# Patient Record
Sex: Female | Born: 1975 | Race: White | Hispanic: No | Smoking: Never smoker
Health system: Southern US, Community
[De-identification: ages and names within clinical notes are randomized; demographics above are authoritative.]

## PROBLEM LIST (undated history)

## (undated) HISTORY — PX: MOUTH SURGERY: SHX715

---

## 1999-06-23 ENCOUNTER — Encounter: Admission: RE | Admit: 1999-06-23 | Discharge: 1999-09-21 | Payer: Self-pay | Admitting: Psychology

## 2006-07-18 ENCOUNTER — Inpatient Hospital Stay (HOSPITAL_COMMUNITY): Admission: AD | Admit: 2006-07-18 | Discharge: 2006-07-18 | Payer: Self-pay | Admitting: Obstetrics and Gynecology

## 2007-02-19 ENCOUNTER — Inpatient Hospital Stay (HOSPITAL_COMMUNITY): Admission: AD | Admit: 2007-02-19 | Discharge: 2007-02-19 | Payer: Self-pay | Admitting: Obstetrics & Gynecology

## 2007-04-29 ENCOUNTER — Inpatient Hospital Stay (HOSPITAL_COMMUNITY): Admission: AD | Admit: 2007-04-29 | Discharge: 2007-05-02 | Payer: Self-pay | Admitting: Obstetrics and Gynecology

## 2007-05-04 ENCOUNTER — Inpatient Hospital Stay (HOSPITAL_COMMUNITY): Admission: AD | Admit: 2007-05-04 | Discharge: 2007-05-04 | Payer: Self-pay | Admitting: Obstetrics and Gynecology

## 2008-12-12 ENCOUNTER — Ambulatory Visit (HOSPITAL_COMMUNITY): Admission: RE | Admit: 2008-12-12 | Discharge: 2008-12-12 | Payer: Self-pay | Admitting: Gynecology

## 2009-08-14 ENCOUNTER — Ambulatory Visit (HOSPITAL_COMMUNITY): Admission: RE | Admit: 2009-08-14 | Discharge: 2009-08-14 | Payer: Self-pay | Admitting: Obstetrics and Gynecology

## 2009-09-10 DEATH — deceased

## 2010-01-02 ENCOUNTER — Inpatient Hospital Stay (HOSPITAL_COMMUNITY): Admission: AD | Admit: 2010-01-02 | Discharge: 2010-01-02 | Payer: Self-pay | Admitting: Obstetrics and Gynecology

## 2010-06-30 LAB — CBC
HCT: 38.2 % (ref 36.0–46.0)
Hemoglobin: 13.5 g/dL (ref 12.0–15.0)
MCHC: 35.3 g/dL (ref 30.0–36.0)
MCV: 93.9 fL (ref 78.0–100.0)
Platelets: 204 10*3/uL (ref 150–400)
RDW: 13.1 % (ref 11.5–15.5)

## 2010-07-23 ENCOUNTER — Inpatient Hospital Stay (HOSPITAL_COMMUNITY)
Admission: RE | Admit: 2010-07-23 | Payer: Managed Care, Other (non HMO) | Source: Ambulatory Visit | Admitting: Obstetrics and Gynecology

## 2010-07-23 ENCOUNTER — Inpatient Hospital Stay (HOSPITAL_COMMUNITY)
Admission: AD | Admit: 2010-07-23 | Discharge: 2010-07-23 | Disposition: A | Discharge status: Home / Self Care | Payer: Managed Care, Other (non HMO) | Source: Ambulatory Visit | Attending: Obstetrics and Gynecology | Admitting: Obstetrics and Gynecology

## 2010-07-23 DIAGNOSIS — O479 False labor, unspecified: Secondary | ICD-10-CM | POA: Insufficient documentation

## 2010-07-25 ENCOUNTER — Inpatient Hospital Stay (HOSPITAL_COMMUNITY)
Admission: AD | Admit: 2010-07-25 | Discharge: 2010-07-27 | DRG: 775 | Disposition: A | Discharge status: Home / Self Care | Payer: Managed Care, Other (non HMO) | Source: Ambulatory Visit | Attending: Obstetrics and Gynecology | Admitting: Obstetrics and Gynecology

## 2010-07-25 LAB — CBC
MCH: 32.3 pg (ref 26.0–34.0)
MCHC: 33.7 g/dL (ref 30.0–36.0)
Platelets: 185 10*3/uL (ref 150–400)
RDW: 13.2 % (ref 11.5–15.5)

## 2010-07-25 LAB — RPR: RPR Ser Ql: NONREACTIVE

## 2010-07-26 LAB — CBC
MCHC: 33.7 g/dL (ref 30.0–36.0)
Platelets: 164 10*3/uL (ref 150–400)
RDW: 13.3 % (ref 11.5–15.5)
WBC: 8.8 10*3/uL (ref 4.0–10.5)

## 2010-07-28 NOTE — Discharge Summary (Signed)
  [name], Kim Sparks                ACCOUNT NO.:  [id number]  MEDICAL RECORD NO.:  [id number]           PATIENT TYPE:  I  LOCATION:  9133                          FACILITY:  [facility]  PHYSICIAN:  Gerrit Friends. Aldona Bar, M.D.   DATE OF BIRTH:  [birth year]  DATE OF ADMISSION:  07/25/2010 DATE OF DISCHARGE:  07/27/2010                              DISCHARGE SUMMARY   DISCHARGE DIAGNOSES: 1. Term pregnancy delivered 8 pounds 7 ounces female infant, Apgar's 9     and 9. 2. Blood type O+.  PROCEDURE: 1. Induction of labor. 2. Normal spontaneous delivery. 3. Second-degree tear and repair.  SUMMARY:  This 35 year old gravida 3, para 1 was admitted at term for induction of labor.  Her pregnancy was benign.  She progressed and subsequently on the evening of April 14 had a normal spontaneous delivery of a viable 8 pounds 7 ounces female infant with Apgar's of 9 and 9 over a second-degree tear which was repaired without difficulty.  Her postpartum course was benign.  Her discharge hemoglobin was 11.0 with a white count of 8800 and a platelet count of 164,000.  On the morning of April 16, she was ambulating well, tolerating a regular diet well, having normal bowel and bladder function, was afebrile, was breast-feeding without difficulty, was desirous of discharge.  She was given instructions as far as the baby's circumcision is concerned and likewise given an instruction brochure for her postpartum course and understood all instructions well.  Discharge medications include vitamins 1 a day as long she is breast- feeding and she was given a prescription for Tylox 1-2 every 4-6 hours as needed for severe pain and Motrin 600 mg every 6 hours as needed for mild pain or cramping.  She will return to the office for followup in approximately 4 weeks' time or as needed.  CONDITION ON DISCHARGE:  Improved.     Gerrit Friends. Aldona Bar, M.D.     RMW/MEDQ  D:  07/27/2010  T:  07/27/2010  Job:   [id number]  Electronically Signed by Annamaria Helling M.D. on 07/28/2010 32:20:25 AM

## 2010-08-07 NOTE — H&P (Signed)
  Kim, Sparks                ACCOUNT NO.:  [id number]  MEDICAL RECORD NO.:  [id number]           PATIENT TYPE:  I  LOCATION:  9133                          FACILITY:  [facility]  PHYSICIAN:  Riely Oetken H. Tenny Craw, MD     DATE OF BIRTH:  06/14/1975  DATE OF ADMISSION:  07/25/2010 DATE OF DISCHARGE:                             HISTORY & PHYSICAL   CHIEF COMPLAINT:  Induction of labor.  HISTORY OF PRESENT ILLNESS:  Ms. Muchmore is a 35 year old, G3, P 1-0-1-1 who is admitted at 5 weeks and 3 days' gestational age for an induction of labor for term pregnancy.  Pregnancy has been uncomplicated to this point.  This pregnancy was conceived with gonadotropins and intrauterine insemination.  PAST MEDICAL HISTORY:  None.  PAST SURGICAL HISTORY:  None.  PAST OBSTETRIC HISTORY:  G3, P 1-0-1-1, in 2009 the patient had a post- term spontaneous vaginal delivery of a female infant weighing 7 pounds and 5 ounces.  In 2011, the patient had an 8-week spontaneous miscarriage.  PAST GYNECOLOGICAL HISTORY:  No abnormal Pap smears or sexually transmitted diseases.  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  MEDICATIONS:  Prenatal vitamins.  ALLERGIES:  PENICILLIN.  PRENATAL LABORATORY DATA:  O positive, antibody screen negative, RPR nonreactive, rubella titer immune, hepatitis B surface antigen negative, HIV nonreactive, and declined genetic screening.  1-hour Glucola 89. Pap smear within normal limits.  Gonorrhea and Chlamydia is negative. Ultrasound at 20 weeks showed normal fetal anatomy, female infant, posterior placenta.  An ultrasound for size less than dates performed at 33 weeks demonstrate an estimated fetal weight of 1928 grams, 45th percentile, and amniotic fluid index of 17.2.  Group B strep is negative.  ASSESSMENT AND PLAN:  This is a 35 year old, G3, P1 who presents for a term induction of labor.  The patient may have epidural on  request.  We will place misoprostol 25 mcg per vagina x1 for cervical ripening and then reassess after that.     Freddrick March. Tenny Craw, MD     KHR/MEDQ  D:  07/25/2010  T:  07/26/2010  Job:  [id number]  Electronically Signed by Waynard Reeds MD on 08/07/2010 10:47:09 AM

## 2010-12-31 LAB — COMPREHENSIVE METABOLIC PANEL
ALT: 19
AST: 21
Albumin: 3.1 — ABNORMAL LOW
Alkaline Phosphatase: 100
Calcium: 8.5
GFR calc Af Amer: >60
Potassium: 3.4 — ABNORMAL LOW
Sodium: 137
Total Protein: 6.4

## 2010-12-31 LAB — CBC
HCT: 30.9 — ABNORMAL LOW
Hemoglobin: 13.3
MCHC: 35
MCHC: 35.2
MCV: 98
Platelets: 172
RBC: 3.15 — ABNORMAL LOW
RBC: 3.83 — ABNORMAL LOW
RDW: 12.4
WBC: 8.7

## 2010-12-31 LAB — URINALYSIS, ROUTINE W REFLEX MICROSCOPIC
Bilirubin Urine: NEGATIVE
Glucose, UA: NEGATIVE
Ketones, ur: NEGATIVE
Nitrite: NEGATIVE
Specific Gravity, Urine: 1.01
pH: 5.5

## 2010-12-31 LAB — RPR: RPR Ser Ql: NONREACTIVE

## 2010-12-31 LAB — URINE MICROSCOPIC-ADD ON: RBC / HPF: NONE SEEN

## 2013-10-07 ENCOUNTER — Encounter (HOSPITAL_COMMUNITY): Payer: Self-pay | Admitting: Emergency Medicine

## 2013-10-07 ENCOUNTER — Emergency Department (HOSPITAL_COMMUNITY)
Admission: EM | Admit: 2013-10-07 | Discharge: 2013-10-07 | Disposition: A | Discharge status: Home / Self Care | Payer: BC Managed Care – PPO | Attending: Emergency Medicine | Admitting: Emergency Medicine

## 2013-10-07 DIAGNOSIS — Z792 Long term (current) use of antibiotics: Secondary | ICD-10-CM | POA: Insufficient documentation

## 2013-10-07 DIAGNOSIS — Z79899 Other long term (current) drug therapy: Secondary | ICD-10-CM | POA: Insufficient documentation

## 2013-10-07 DIAGNOSIS — Z87828 Personal history of other (healed) physical injury and trauma: Secondary | ICD-10-CM | POA: Insufficient documentation

## 2013-10-07 DIAGNOSIS — R509 Fever, unspecified: Secondary | ICD-10-CM | POA: Insufficient documentation

## 2013-10-07 DIAGNOSIS — R197 Diarrhea, unspecified: Secondary | ICD-10-CM

## 2013-10-07 DIAGNOSIS — Z880 Allergy status to penicillin: Secondary | ICD-10-CM | POA: Insufficient documentation

## 2013-10-07 LAB — CBC WITH DIFFERENTIAL/PLATELET
BASOS ABS: 0.1 10*3/uL (ref 0.0–0.1)
BASOS PCT: 1 % (ref 0–1)
EOS PCT: 7 % — AB (ref 0–5)
Eosinophils Absolute: 0.3 10*3/uL (ref 0.0–0.7)
HCT: 35.9 % — ABNORMAL LOW (ref 36.0–46.0)
Hemoglobin: 12.1 g/dL (ref 12.0–15.0)
LYMPHS PCT: 30 % (ref 12–46)
Lymphs Abs: 1.2 10*3/uL (ref 0.7–4.0)
MCH: 30.3 pg (ref 26.0–34.0)
MCHC: 33.7 g/dL (ref 30.0–36.0)
MCV: 90 fL (ref 78.0–100.0)
Monocytes Absolute: 0.8 10*3/uL (ref 0.1–1.0)
Monocytes Relative: 21 % — ABNORMAL HIGH (ref 3–12)
NEUTROS ABS: 1.6 10*3/uL — AB (ref 1.7–7.7)
Neutrophils Relative %: 41 % — ABNORMAL LOW (ref 43–77)
PLATELETS: 218 10*3/uL (ref 150–400)
RBC: 3.99 MIL/uL (ref 3.87–5.11)
RDW: 12.5 % (ref 11.5–15.5)
WBC: 3.9 10*3/uL — AB (ref 4.0–10.5)

## 2013-10-07 LAB — COMPREHENSIVE METABOLIC PANEL
ALBUMIN: 3.4 g/dL — AB (ref 3.5–5.2)
ALT: 25 U/L (ref 0–35)
AST: 30 U/L (ref 0–37)
Alkaline Phosphatase: 51 U/L (ref 39–117)
BUN: 7 mg/dL (ref 6–23)
CALCIUM: 8.9 mg/dL (ref 8.4–10.5)
CHLORIDE: 104 meq/L (ref 96–112)
CO2: 29 meq/L (ref 19–32)
Creatinine, Ser: 0.68 mg/dL (ref 0.50–1.10)
GFR calc Af Amer: >90 mL/min (ref >90)
Glucose, Bld: 70 mg/dL (ref 70–99)
Potassium: 4 mEq/L (ref 3.7–5.3)
SODIUM: 142 meq/L (ref 137–147)
Total Bilirubin: 0.5 mg/dL (ref 0.3–1.2)
Total Protein: 7.3 g/dL (ref 6.0–8.3)

## 2013-10-07 LAB — CLOSTRIDIUM DIFFICILE BY PCR: Toxigenic C. Difficile by PCR: NEGATIVE

## 2013-10-07 MED ORDER — METRONIDAZOLE 500 MG PO TABS: 500.0000 mg | ORAL_TABLET | Freq: Two times a day (BID) | ORAL | Status: DC

## 2013-10-07 MED ORDER — LACTINEX PO CHEW: 1.0000 | CHEWABLE_TABLET | Freq: Three times a day (TID) | ORAL | Status: DC

## 2013-10-07 NOTE — ED Notes (Signed)
Pt c/o fever, chills, diarrhea x 6 days, pt was seen by PCP Friday put on antbx, fever has subsided but diarrhea continues.

## 2013-10-07 NOTE — ED Provider Notes (Signed)
CSN: [id number]     Arrival date & time 10/07/13  [id number] History  First MD Initiated Contact with Patient 10/07/13 0735     Chief Complaint  Patient presents with  . Diarrhea    HPI Comments: It started earlier in the week.  Initially it was brown colored loose diarrhea.  Then she started having fever and chills.  The fevers and chills have resolved at this point but she continues to have diarrhea especially after eating.   Last night she had green beans, chicken and rice and had another episode of multiple loose stools.  Her husband is travelling this coming week and she wants to be OK to take care of her children.  Patient is a 38 y.o. female presenting with diarrhea. The history is provided by the patient.  Diarrhea Quality:  Mucous Severity:  Moderate Onset quality:  Gradual Number of episodes:  3 yesterday after a meal, 3 this am after eating Duration:  1 week Timing:  Intermittent Relieved by:  Nothing Exacerbated by: eating. Risk factors: sick contacts (son had a mild illness a week ago)   Risk factors: no suspicious food intake and no travel to endemic areas   She saw her primary doctor this week and was started on cipro.  She started that on Friday.  She had been on some antibiotics recently for gum surgery.  That was last week.    History reviewed. No pertinent past medical history. Past Surgical History  Procedure Laterality Date  . Mouth surgery     No family history on file. History  Substance Use Topics  . Smoking status: Never Smoker   . Smokeless tobacco: Not on file  . Alcohol Use: Yes     Comment: occ   OB History   Grav Para Term Preterm Abortions TAB SAB Ect Mult Living                 Review of Systems  Gastrointestinal: Positive for diarrhea.  All other systems reviewed and are negative.     Allergies  Penicillins  Home Medications   Prior to Admission medications   Medication Sig Start Date End Date Taking? Authorizing Provider   ciprofloxacin (CIPRO) 500 MG tablet Take 500 mg by mouth 2 (two) times daily. For 5 days started on 10-05-2013 10/05/13  Yes Historical Provider, MD  escitalopram (LEXAPRO) 5 MG tablet Take 5 mg by mouth at bedtime.   Yes Historical Provider, MD  lactobacillus acidophilus & bulgar (LACTINEX) chewable tablet Chew 1 tablet by mouth 3 (three) times daily with meals. 10/07/13   Linwood DibblesJon Knapp, MD  metroNIDAZOLE (FLAGYL) 500 MG tablet Take 1 tablet (500 mg total) by mouth 2 (two) times daily. 10/07/13   Linwood DibblesJon Knapp, MD   BP 105/68  Pulse 55  Temp(Src) 97.5 F (36.4 C) (Oral)  Resp 20  Ht 5\' 3"  (1.6 m)  Wt 108 lb (48.988 kg)  BMI 19.14 kg/m2  SpO2 100%  LMP 09/23/2013 Physical Exam  Nursing note and vitals reviewed. Constitutional: She appears well-developed and well-nourished. No distress.  HENT:  Head: Normocephalic and atraumatic.  Right Ear: External ear normal.  Left Ear: External ear normal.  Eyes: Conjunctivae are normal. Right eye exhibits no discharge. Left eye exhibits no discharge. No scleral icterus.  Neck: Neck supple. No tracheal deviation present.  Cardiovascular: Normal rate, regular rhythm and intact distal pulses.   Pulmonary/Chest: Effort normal and breath sounds normal. No stridor. No respiratory distress. She has no wheezes. She  has no rales.  Abdominal: Soft. Bowel sounds are normal. She exhibits no distension. There is no tenderness. There is no rebound and no guarding.  Musculoskeletal: She exhibits no edema and no tenderness.  Neurological: She is alert. She has normal strength. No cranial nerve deficit (no facial droop, extraocular movements intact, no slurred speech) or sensory deficit. She exhibits normal muscle tone. She displays no seizure activity. Coordination normal.  Skin: Skin is warm and dry. No rash noted.  Psychiatric: She has a normal mood and affect.    ED Course  Procedures (including critical care time) Labs Review Labs Reviewed  CBC WITH DIFFERENTIAL -  Abnormal; Notable for the following:    WBC 3.9 (*)    HCT 35.9 (*)    Neutrophils Relative % 41 (*)    Neutro Abs 1.6 (*)    Monocytes Relative 21 (*)    Eosinophils Relative 7 (*)    All other components within normal limits  COMPREHENSIVE METABOLIC PANEL - Abnormal; Notable for the following:    Albumin 3.4 (*)    All other components within normal limits  STOOL CULTURE  CLOSTRIDIUM DIFFICILE BY PCR      MDM   Final diagnoses:  Diarrhea   Patient's concerned about her persistent symptoms. She is no longer having fever but she continues to have diarrhea. The patient was on antibiotics recently. It is possible her symptoms could be related to C. Difficile.   RX for flagyl and probiotics.  Follow up with pcp.  At this time there does not appear to be any evidence of an acute emergency medical condition and the patient appears stable for discharge with appropriate outpatient follow up.    Linwood DibblesJon Knapp, MD 10/07/13 1004

## 2013-10-07 NOTE — Discharge Instructions (Signed)
Clostridium Difficile Toxin This is a test which may be done when a patient has diarrhea that lasts for several days, or has abdominal pain, fever, and nausea after antibiotic therapy.  This test looks for the presence of Clostridium difficile (C.diff.) toxin in a stool sample. C.diff. is a germ (bacterium) that is one of the groups of bacteria that are usually in the colon, called "normal flora." If something upsets the growth of the other normal flora, C.diff. may overgrow and disrupt the balance of bacteria in the colon. C. diff. may produce two toxins, A and B. The combination of overgrowth and toxins may cause prolonged diarrhea. The toxins may damage the lining of the colon and lead to colitis.  While some cases of C. diff. diarrhea and colitis do not require treatment, others require specific oral antibiotic therapy. Most patients improve as the normal flora re-establishes itself, but about some may have one or more relapses, with symptoms and detectible toxin levels coming back. PREPARATION FOR TEST There is no special preparation for the test. A fresh stool sample is collected in a sterile container. The sample should not be mixed with urine or water. The stool should be taken to the lab within an hour. It may be refrigerated or frozen and taken to the lab as soon as possible. The container should be labeled with your name and the date and time of the stool collection.  NORMAL FINDINGS Negative Tissue Culture (no toxin identified) Ranges for normal findings may vary among different laboratories and hospitals. You should always check with your doctor after having lab work or other tests done to discuss the meaning of your test results and whether your values are considered within normal limits. MEANING OF TEST  Your caregiver will go over the test results with you and discuss the importance and meaning of your results, as well as treatment options and the need for additional tests if  necessary. OBTAINING THE TEST RESULTS It is your responsibility to obtain your test results. Ask the lab or department performing the test when and how you will get your results. Document Released: 04/21/2004 Document Revised: 06/21/2011 Document Reviewed: 03/07/2008 [facility] CenterExitCare Patient Information 2015 [place], [place]. This information is not intended to replace advice given to you by your health care provider. Make sure you discuss any questions you have with your health care provider.  Diarrhea Diarrhea is frequent loose and watery bowel movements. It can cause you to feel weak and dehydrated. Dehydration can cause you to become tired and thirsty, have a dry mouth, and have decreased urination that often is dark yellow. Diarrhea is a sign of another problem, most often an infection that will not last long. In most cases, diarrhea typically lasts 2-3 days. However, it can last longer if it is a sign of something more serious. It is important to treat your diarrhea as directed by your caregive to lessen or prevent future episodes of diarrhea. CAUSES  Some common causes include:  Gastrointestinal infections caused by viruses, bacteria, or parasites.  Food poisoning or food allergies.  Certain medicines, such as antibiotics, chemotherapy, and laxatives.  Artificial sweeteners and fructose.  Digestive disorders. HOME CARE INSTRUCTIONS  Ensure adequate fluid intake (hydration): have 1 cup (8 oz) of fluid for each diarrhea episode. Avoid fluids that contain simple sugars or sports drinks, fruit juices, whole milk products, and sodas. Your urine should be clear or pale yellow if you are drinking enough fluids. Hydrate with an oral rehydration solution that you  can purchase at pharmacies, retail stores, and online. You can prepare an oral rehydration solution at home by mixing the following ingredients together:   - tsp table salt.   tsp baking soda.   tsp salt substitute containing potassium  chloride.  1  tablespoons sugar.  1 L (34 oz) of water.  Certain foods and beverages may increase the speed at which food moves through the gastrointestinal (GI) tract. These foods and beverages should be avoided and include:  Caffeinated and alcoholic beverages.  High-fiber foods, such as raw fruits and vegetables, nuts, seeds, and whole grain breads and cereals.  Foods and beverages sweetened with sugar alcohols, such as xylitol, sorbitol, and mannitol.  Some foods may be well tolerated and may help thicken stool including:  Starchy foods, such as rice, toast, pasta, low-sugar cereal, oatmeal, grits, baked potatoes, crackers, and bagels.  Bananas.  Applesauce.  Add probiotic-rich foods to help increase healthy bacteria in the GI tract, such as yogurt and fermented milk products.  Wash your hands well after each diarrhea episode.  Only take over-the-counter or prescription medicines as directed by your caregiver.  Take a warm bath to relieve any burning or pain from frequent diarrhea episodes. SEEK IMMEDIATE MEDICAL CARE IF:   You are unable to keep fluids down.  You have persistent vomiting.  You have blood in your stool, or your stools are black and tarry.  You do not urinate in 6-8 hours, or there is only a small amount of very dark urine.  You have abdominal pain that increases or localizes.  You have weakness, dizziness, confusion, or lightheadedness.  You have a severe headache.  Your diarrhea gets worse or does not get better.  You have a fever or persistent symptoms for more than 2-3 days.  You have a fever and your symptoms suddenly get worse. MAKE SURE YOU:   Understand these instructions.  Will watch your condition.  Will get help right away if you are not doing well or get worse. Document Released: 03/19/2002 Document Revised: 03/15/2012 Document Reviewed: 12/05/2011 [facility] CenterExitCare Patient Information 2015 [place]Care, [place]LL[place]. This information is not  intended to replace advice given to you by your health care provider. Make sure you discuss any questions you have with your health care provider.

## 2013-11-29 ENCOUNTER — Other Ambulatory Visit: Payer: Self-pay | Admitting: Obstetrics and Gynecology

## 2013-11-30 LAB — CYTOLOGY - PAP

## 2015-10-11 DIAGNOSIS — F909 Attention-deficit hyperactivity disorder, unspecified type: Secondary | ICD-10-CM | POA: Diagnosis not present

## 2015-12-27 DIAGNOSIS — J0111 Acute recurrent frontal sinusitis: Secondary | ICD-10-CM | POA: Diagnosis not present

## 2015-12-27 DIAGNOSIS — Z1383 Encounter for screening for respiratory disorder NEC: Secondary | ICD-10-CM | POA: Diagnosis not present

## 2015-12-27 DIAGNOSIS — F411 Generalized anxiety disorder: Secondary | ICD-10-CM | POA: Diagnosis not present

## 2016-01-01 DIAGNOSIS — F411 Generalized anxiety disorder: Secondary | ICD-10-CM | POA: Diagnosis not present

## 2016-01-30 DIAGNOSIS — M79642 Pain in left hand: Secondary | ICD-10-CM | POA: Diagnosis not present

## 2016-02-28 DIAGNOSIS — Z23 Encounter for immunization: Secondary | ICD-10-CM | POA: Diagnosis not present

## 2016-04-08 DIAGNOSIS — F909 Attention-deficit hyperactivity disorder, unspecified type: Secondary | ICD-10-CM | POA: Diagnosis not present

## 2016-06-03 DIAGNOSIS — R05 Cough: Secondary | ICD-10-CM | POA: Diagnosis not present

## 2016-06-03 DIAGNOSIS — J069 Acute upper respiratory infection, unspecified: Secondary | ICD-10-CM | POA: Diagnosis not present

## 2016-06-11 DIAGNOSIS — M940 Chondrocostal junction syndrome [Tietze]: Secondary | ICD-10-CM | POA: Diagnosis not present

## 2016-06-23 DIAGNOSIS — N63 Unspecified lump in unspecified breast: Secondary | ICD-10-CM | POA: Diagnosis not present

## 2016-06-23 DIAGNOSIS — R0781 Pleurodynia: Secondary | ICD-10-CM | POA: Diagnosis not present

## 2016-06-23 DIAGNOSIS — R0789 Other chest pain: Secondary | ICD-10-CM | POA: Diagnosis not present

## 2016-07-08 DIAGNOSIS — N6313 Unspecified lump in the right breast, lower outer quadrant: Secondary | ICD-10-CM | POA: Diagnosis not present

## 2016-08-24 DIAGNOSIS — F909 Attention-deficit hyperactivity disorder, unspecified type: Secondary | ICD-10-CM | POA: Diagnosis not present

## 2016-08-24 DIAGNOSIS — Z681 Body mass index (BMI) 19 or less, adult: Secondary | ICD-10-CM | POA: Diagnosis not present

## 2016-10-06 DIAGNOSIS — Z0000 Encounter for general adult medical examination without abnormal findings: Secondary | ICD-10-CM | POA: Diagnosis not present

## 2016-10-08 DIAGNOSIS — R945 Abnormal results of liver function studies: Secondary | ICD-10-CM | POA: Diagnosis not present

## 2016-10-08 DIAGNOSIS — Z681 Body mass index (BMI) 19 or less, adult: Secondary | ICD-10-CM | POA: Diagnosis not present

## 2016-10-08 DIAGNOSIS — F909 Attention-deficit hyperactivity disorder, unspecified type: Secondary | ICD-10-CM | POA: Diagnosis not present

## 2016-10-08 DIAGNOSIS — Z0000 Encounter for general adult medical examination without abnormal findings: Secondary | ICD-10-CM | POA: Diagnosis not present

## 2017-02-24 DIAGNOSIS — F909 Attention-deficit hyperactivity disorder, unspecified type: Secondary | ICD-10-CM | POA: Diagnosis not present

## 2017-06-08 DIAGNOSIS — F909 Attention-deficit hyperactivity disorder, unspecified type: Secondary | ICD-10-CM | POA: Diagnosis not present

## 2017-08-11 DIAGNOSIS — F4329 Adjustment disorder with other symptoms: Secondary | ICD-10-CM | POA: Diagnosis not present

## 2017-08-11 DIAGNOSIS — R1032 Left lower quadrant pain: Secondary | ICD-10-CM | POA: Diagnosis not present

## 2017-08-11 DIAGNOSIS — M25552 Pain in left hip: Secondary | ICD-10-CM | POA: Diagnosis not present

## 2017-11-29 DIAGNOSIS — Z6821 Body mass index (BMI) 21.0-21.9, adult: Secondary | ICD-10-CM | POA: Diagnosis not present

## 2017-11-29 DIAGNOSIS — F909 Attention-deficit hyperactivity disorder, unspecified type: Secondary | ICD-10-CM | POA: Diagnosis not present

## 2017-12-18 DIAGNOSIS — J020 Streptococcal pharyngitis: Secondary | ICD-10-CM | POA: Diagnosis not present

## 2018-01-29 DIAGNOSIS — Z23 Encounter for immunization: Secondary | ICD-10-CM | POA: Diagnosis not present

## 2018-06-27 DIAGNOSIS — F902 Attention-deficit hyperactivity disorder, combined type: Secondary | ICD-10-CM | POA: Diagnosis not present

## 2018-07-27 DIAGNOSIS — F909 Attention-deficit hyperactivity disorder, unspecified type: Secondary | ICD-10-CM | POA: Diagnosis not present

## 2018-07-27 DIAGNOSIS — F411 Generalized anxiety disorder: Secondary | ICD-10-CM | POA: Diagnosis not present

## 2018-08-23 DIAGNOSIS — F909 Attention-deficit hyperactivity disorder, unspecified type: Secondary | ICD-10-CM | POA: Diagnosis not present

## 2018-08-23 DIAGNOSIS — F411 Generalized anxiety disorder: Secondary | ICD-10-CM | POA: Diagnosis not present

## 2018-08-23 DIAGNOSIS — F5081 Binge eating disorder: Secondary | ICD-10-CM | POA: Diagnosis not present

## 2018-09-13 DIAGNOSIS — F902 Attention-deficit hyperactivity disorder, combined type: Secondary | ICD-10-CM | POA: Diagnosis not present

## 2018-11-16 DIAGNOSIS — F430 Acute stress reaction: Secondary | ICD-10-CM | POA: Diagnosis not present

## 2018-12-14 DIAGNOSIS — F902 Attention-deficit hyperactivity disorder, combined type: Secondary | ICD-10-CM | POA: Diagnosis not present

## 2018-12-14 DIAGNOSIS — F411 Generalized anxiety disorder: Secondary | ICD-10-CM | POA: Diagnosis not present

## 2018-12-14 DIAGNOSIS — F430 Acute stress reaction: Secondary | ICD-10-CM | POA: Diagnosis not present

## 2019-04-03 ENCOUNTER — Other Ambulatory Visit: Payer: Managed Care, Other (non HMO)

## 2019-05-08 ENCOUNTER — Ambulatory Visit (INDEPENDENT_AMBULATORY_CARE_PROVIDER_SITE_OTHER): Payer: BC Managed Care – PPO

## 2019-05-08 ENCOUNTER — Ambulatory Visit (INDEPENDENT_AMBULATORY_CARE_PROVIDER_SITE_OTHER): Payer: BC Managed Care – PPO | Admitting: Podiatry

## 2019-05-08 ENCOUNTER — Other Ambulatory Visit: Payer: Self-pay

## 2019-05-08 DIAGNOSIS — S92501A Displaced unspecified fracture of right lesser toe(s), initial encounter for closed fracture: Secondary | ICD-10-CM | POA: Diagnosis not present

## 2019-05-08 MED ORDER — HYDROCODONE-ACETAMINOPHEN 5-325 MG PO TABS: 1.0000 | ORAL_TABLET | Freq: Four times a day (QID) | ORAL | 0 refills | Status: DC | PRN

## 2019-05-10 NOTE — Progress Notes (Signed)
   HPI: 44 y.o. female presenting today as a new patient with a chief complaint of an injury to the right foot that occurred yesterday. She states she tripped over her dog which immediately caused throbbing pain to the right hallux. She reports associated swelling of the great toe. Applying pressure to the forefoot increases her pain. She has been walking on her heel but has not had any treatment for her symptoms. Patient is here for further evaluation and treatment.   No past medical history on file.   Physical Exam: General: The patient is alert and oriented x3 in no acute distress.  Dermatology: Skin is warm, dry and supple bilateral lower extremities. Negative for open lesions or macerations.  Vascular: Palpable pedal pulses bilaterally. No edema or erythema noted. Capillary refill within normal limits.  Neurological: Epicritic and protective threshold grossly intact bilaterally.   Musculoskeletal Exam: Pain with palpation noted to the right hallux proximally.   Radiographic Exam:  Closed, minimally displaced fracture of the proximal phalanx of the right hallux noted.     Assessment: 1. Fracture right hallux, proximal phalanx, minimally displaced   Plan of Care:  1. Patient evaluated. X-Rays reviewed.  2. CAM boot dispensed.  3. Prescription for Vicodin 5/325 mg provided to patient.  4. Return to clinic in 6 weeks for follow up X-Ray.       Felecia Shelling, DPM [facility]  Dr. Felecia Shelling, DPM    [address] [place], [place] [address]                Office [phone]  Fax [phone]

## 2019-06-13 ENCOUNTER — Ambulatory Visit: Payer: BC Managed Care – PPO

## 2019-06-13 ENCOUNTER — Encounter: Payer: BC Managed Care – PPO | Admitting: Podiatry

## 2019-06-27 NOTE — Progress Notes (Signed)
This encounter was created in error - please disregard.

## 2020-04-18 DIAGNOSIS — F988 Other specified behavioral and emotional disorders with onset usually occurring in childhood and adolescence: Secondary | ICD-10-CM | POA: Diagnosis not present

## 2020-04-28 ENCOUNTER — Other Ambulatory Visit: Payer: Self-pay | Admitting: Podiatry

## 2020-04-28 MED ORDER — GENTAMICIN SULFATE 0.1 % EX CREA: 1.0000 "application " | TOPICAL_CREAM | Freq: Two times a day (BID) | CUTANEOUS | 1 refills | Status: DC

## 2020-04-28 MED ORDER — DOXYCYCLINE HYCLATE 100 MG PO TABS: 100.0000 mg | ORAL_TABLET | Freq: Two times a day (BID) | ORAL | 0 refills | Status: DC

## 2020-06-19 DIAGNOSIS — Z3202 Encounter for pregnancy test, result negative: Secondary | ICD-10-CM | POA: Diagnosis not present

## 2020-06-19 DIAGNOSIS — Z30011 Encounter for initial prescription of contraceptive pills: Secondary | ICD-10-CM | POA: Diagnosis not present

## 2020-08-26 DIAGNOSIS — F988 Other specified behavioral and emotional disorders with onset usually occurring in childhood and adolescence: Secondary | ICD-10-CM | POA: Diagnosis not present

## 2020-10-29 DIAGNOSIS — F909 Attention-deficit hyperactivity disorder, unspecified type: Secondary | ICD-10-CM | POA: Diagnosis not present

## 2020-10-29 DIAGNOSIS — F329 Major depressive disorder, single episode, unspecified: Secondary | ICD-10-CM | POA: Diagnosis not present

## 2020-10-29 DIAGNOSIS — F1020 Alcohol dependence, uncomplicated: Secondary | ICD-10-CM | POA: Diagnosis not present

## 2020-11-02 DIAGNOSIS — F329 Major depressive disorder, single episode, unspecified: Secondary | ICD-10-CM | POA: Diagnosis not present

## 2020-11-02 DIAGNOSIS — F1020 Alcohol dependence, uncomplicated: Secondary | ICD-10-CM | POA: Diagnosis not present

## 2020-11-02 DIAGNOSIS — F909 Attention-deficit hyperactivity disorder, unspecified type: Secondary | ICD-10-CM | POA: Diagnosis not present

## 2020-11-03 DIAGNOSIS — F329 Major depressive disorder, single episode, unspecified: Secondary | ICD-10-CM | POA: Diagnosis not present

## 2020-11-03 DIAGNOSIS — F1020 Alcohol dependence, uncomplicated: Secondary | ICD-10-CM | POA: Diagnosis not present

## 2020-11-03 DIAGNOSIS — F909 Attention-deficit hyperactivity disorder, unspecified type: Secondary | ICD-10-CM | POA: Diagnosis not present

## 2020-11-04 DIAGNOSIS — F329 Major depressive disorder, single episode, unspecified: Secondary | ICD-10-CM | POA: Diagnosis not present

## 2020-11-04 DIAGNOSIS — F1020 Alcohol dependence, uncomplicated: Secondary | ICD-10-CM | POA: Diagnosis not present

## 2020-11-04 DIAGNOSIS — F909 Attention-deficit hyperactivity disorder, unspecified type: Secondary | ICD-10-CM | POA: Diagnosis not present

## 2020-11-05 DIAGNOSIS — F329 Major depressive disorder, single episode, unspecified: Secondary | ICD-10-CM | POA: Diagnosis not present

## 2020-11-05 DIAGNOSIS — F909 Attention-deficit hyperactivity disorder, unspecified type: Secondary | ICD-10-CM | POA: Diagnosis not present

## 2020-11-05 DIAGNOSIS — F1020 Alcohol dependence, uncomplicated: Secondary | ICD-10-CM | POA: Diagnosis not present

## 2020-11-06 DIAGNOSIS — F909 Attention-deficit hyperactivity disorder, unspecified type: Secondary | ICD-10-CM | POA: Diagnosis not present

## 2020-11-06 DIAGNOSIS — F329 Major depressive disorder, single episode, unspecified: Secondary | ICD-10-CM | POA: Diagnosis not present

## 2020-11-06 DIAGNOSIS — F1020 Alcohol dependence, uncomplicated: Secondary | ICD-10-CM | POA: Diagnosis not present

## 2020-11-07 DIAGNOSIS — F329 Major depressive disorder, single episode, unspecified: Secondary | ICD-10-CM | POA: Diagnosis not present

## 2020-11-07 DIAGNOSIS — F909 Attention-deficit hyperactivity disorder, unspecified type: Secondary | ICD-10-CM | POA: Diagnosis not present

## 2020-11-07 DIAGNOSIS — F1020 Alcohol dependence, uncomplicated: Secondary | ICD-10-CM | POA: Diagnosis not present

## 2020-11-08 DIAGNOSIS — F909 Attention-deficit hyperactivity disorder, unspecified type: Secondary | ICD-10-CM | POA: Diagnosis not present

## 2020-11-08 DIAGNOSIS — F1020 Alcohol dependence, uncomplicated: Secondary | ICD-10-CM | POA: Diagnosis not present

## 2020-11-08 DIAGNOSIS — F329 Major depressive disorder, single episode, unspecified: Secondary | ICD-10-CM | POA: Diagnosis not present

## 2020-11-09 DIAGNOSIS — F329 Major depressive disorder, single episode, unspecified: Secondary | ICD-10-CM | POA: Diagnosis not present

## 2020-11-09 DIAGNOSIS — F909 Attention-deficit hyperactivity disorder, unspecified type: Secondary | ICD-10-CM | POA: Diagnosis not present

## 2020-11-09 DIAGNOSIS — F1020 Alcohol dependence, uncomplicated: Secondary | ICD-10-CM | POA: Diagnosis not present

## 2020-11-10 DIAGNOSIS — F329 Major depressive disorder, single episode, unspecified: Secondary | ICD-10-CM | POA: Diagnosis not present

## 2020-11-10 DIAGNOSIS — F909 Attention-deficit hyperactivity disorder, unspecified type: Secondary | ICD-10-CM | POA: Diagnosis not present

## 2020-11-10 DIAGNOSIS — F1020 Alcohol dependence, uncomplicated: Secondary | ICD-10-CM | POA: Diagnosis not present

## 2020-11-11 DIAGNOSIS — F1020 Alcohol dependence, uncomplicated: Secondary | ICD-10-CM | POA: Diagnosis not present

## 2020-11-11 DIAGNOSIS — F909 Attention-deficit hyperactivity disorder, unspecified type: Secondary | ICD-10-CM | POA: Diagnosis not present

## 2020-11-11 DIAGNOSIS — F329 Major depressive disorder, single episode, unspecified: Secondary | ICD-10-CM | POA: Diagnosis not present

## 2020-11-12 DIAGNOSIS — F1020 Alcohol dependence, uncomplicated: Secondary | ICD-10-CM | POA: Diagnosis not present

## 2020-11-12 DIAGNOSIS — F329 Major depressive disorder, single episode, unspecified: Secondary | ICD-10-CM | POA: Diagnosis not present

## 2020-11-12 DIAGNOSIS — F909 Attention-deficit hyperactivity disorder, unspecified type: Secondary | ICD-10-CM | POA: Diagnosis not present

## 2020-11-13 DIAGNOSIS — F1020 Alcohol dependence, uncomplicated: Secondary | ICD-10-CM | POA: Diagnosis not present

## 2020-11-13 DIAGNOSIS — F329 Major depressive disorder, single episode, unspecified: Secondary | ICD-10-CM | POA: Diagnosis not present

## 2020-11-13 DIAGNOSIS — F909 Attention-deficit hyperactivity disorder, unspecified type: Secondary | ICD-10-CM | POA: Diagnosis not present

## 2020-11-14 DIAGNOSIS — F1020 Alcohol dependence, uncomplicated: Secondary | ICD-10-CM | POA: Diagnosis not present

## 2020-11-14 DIAGNOSIS — F329 Major depressive disorder, single episode, unspecified: Secondary | ICD-10-CM | POA: Diagnosis not present

## 2020-11-14 DIAGNOSIS — F909 Attention-deficit hyperactivity disorder, unspecified type: Secondary | ICD-10-CM | POA: Diagnosis not present

## 2020-11-15 DIAGNOSIS — F909 Attention-deficit hyperactivity disorder, unspecified type: Secondary | ICD-10-CM | POA: Diagnosis not present

## 2020-11-15 DIAGNOSIS — F1020 Alcohol dependence, uncomplicated: Secondary | ICD-10-CM | POA: Diagnosis not present

## 2020-11-15 DIAGNOSIS — F329 Major depressive disorder, single episode, unspecified: Secondary | ICD-10-CM | POA: Diagnosis not present

## 2020-11-16 DIAGNOSIS — F1020 Alcohol dependence, uncomplicated: Secondary | ICD-10-CM | POA: Diagnosis not present

## 2020-11-16 DIAGNOSIS — F329 Major depressive disorder, single episode, unspecified: Secondary | ICD-10-CM | POA: Diagnosis not present

## 2020-11-16 DIAGNOSIS — F909 Attention-deficit hyperactivity disorder, unspecified type: Secondary | ICD-10-CM | POA: Diagnosis not present

## 2020-11-17 DIAGNOSIS — F329 Major depressive disorder, single episode, unspecified: Secondary | ICD-10-CM | POA: Diagnosis not present

## 2020-11-17 DIAGNOSIS — F1020 Alcohol dependence, uncomplicated: Secondary | ICD-10-CM | POA: Diagnosis not present

## 2020-11-17 DIAGNOSIS — F909 Attention-deficit hyperactivity disorder, unspecified type: Secondary | ICD-10-CM | POA: Diagnosis not present

## 2020-11-18 DIAGNOSIS — F1020 Alcohol dependence, uncomplicated: Secondary | ICD-10-CM | POA: Diagnosis not present

## 2020-11-18 DIAGNOSIS — F329 Major depressive disorder, single episode, unspecified: Secondary | ICD-10-CM | POA: Diagnosis not present

## 2020-11-18 DIAGNOSIS — F909 Attention-deficit hyperactivity disorder, unspecified type: Secondary | ICD-10-CM | POA: Diagnosis not present

## 2020-12-14 ENCOUNTER — Emergency Department (HOSPITAL_BASED_OUTPATIENT_CLINIC_OR_DEPARTMENT_OTHER): Payer: BC Managed Care – PPO | Admitting: Radiology

## 2020-12-14 ENCOUNTER — Other Ambulatory Visit: Payer: Self-pay

## 2020-12-14 ENCOUNTER — Emergency Department (HOSPITAL_BASED_OUTPATIENT_CLINIC_OR_DEPARTMENT_OTHER): Payer: BC Managed Care – PPO

## 2020-12-14 ENCOUNTER — Encounter (HOSPITAL_BASED_OUTPATIENT_CLINIC_OR_DEPARTMENT_OTHER): Payer: Self-pay | Admitting: Obstetrics and Gynecology

## 2020-12-14 ENCOUNTER — Emergency Department (HOSPITAL_BASED_OUTPATIENT_CLINIC_OR_DEPARTMENT_OTHER)
Admission: EM | Admit: 2020-12-14 | Discharge: 2020-12-14 | Disposition: A | Discharge status: Home / Self Care | Payer: BC Managed Care – PPO | Attending: Emergency Medicine | Admitting: Emergency Medicine

## 2020-12-14 DIAGNOSIS — R0789 Other chest pain: Secondary | ICD-10-CM | POA: Diagnosis not present

## 2020-12-14 DIAGNOSIS — S2221XA Fracture of manubrium, initial encounter for closed fracture: Secondary | ICD-10-CM | POA: Diagnosis not present

## 2020-12-14 DIAGNOSIS — R0602 Shortness of breath: Secondary | ICD-10-CM | POA: Diagnosis not present

## 2020-12-14 DIAGNOSIS — W108XXA Fall (on) (from) other stairs and steps, initial encounter: Secondary | ICD-10-CM | POA: Diagnosis not present

## 2020-12-14 DIAGNOSIS — S299XXA Unspecified injury of thorax, initial encounter: Secondary | ICD-10-CM | POA: Diagnosis not present

## 2020-12-14 LAB — BASIC METABOLIC PANEL
Anion gap: 8 (ref 5–15)
BUN: 19 mg/dL (ref 6–20)
CO2: 24 mmol/L (ref 22–32)
Calcium: 8.5 mg/dL — ABNORMAL LOW (ref 8.9–10.3)
Chloride: 107 mmol/L (ref 98–111)
Creatinine, Ser: 0.64 mg/dL (ref 0.44–1.00)
GFR, Estimated: >60 mL/min (ref >60)
Glucose, Bld: 79 mg/dL (ref 70–99)
Potassium: 3.8 mmol/L (ref 3.5–5.1)
Sodium: 139 mmol/L (ref 135–145)

## 2020-12-14 LAB — CBC
HCT: 39.5 % (ref 36.0–46.0)
Hemoglobin: 13.1 g/dL (ref 12.0–15.0)
MCH: 32.8 pg (ref 26.0–34.0)
MCHC: 33.2 g/dL (ref 30.0–36.0)
MCV: 98.8 fL (ref 80.0–100.0)
Platelets: 306 10*3/uL (ref 150–400)
RBC: 4 MIL/uL (ref 3.87–5.11)
RDW: 12.3 % (ref 11.5–15.5)
WBC: 5.8 10*3/uL (ref 4.0–10.5)
nRBC: 0 % (ref 0.0–0.2)

## 2020-12-14 LAB — TROPONIN I (HIGH SENSITIVITY)
Troponin I (High Sensitivity): <2 ng/L (ref <18)
Troponin I (High Sensitivity): <2 ng/L (ref <18)

## 2020-12-14 LAB — PREGNANCY, URINE: Preg Test, Ur: NEGATIVE

## 2020-12-14 MED ORDER — HYDROCODONE-ACETAMINOPHEN 5-325 MG PO TABS: 1.0000 | ORAL_TABLET | Freq: Four times a day (QID) | ORAL | 0 refills | Status: AC | PRN

## 2020-12-14 MED ORDER — HYDROCODONE-ACETAMINOPHEN 5-325 MG PO TABS: 1.0000 | ORAL_TABLET | Freq: Four times a day (QID) | ORAL | 0 refills | Status: DC | PRN

## 2020-12-14 MED ORDER — IOHEXOL 350 MG/ML SOLN
60.0000 mL | Freq: Once | INTRAVENOUS | Status: AC | PRN
  Administered 2020-12-14: 60 mL via INTRAVENOUS

## 2020-12-14 NOTE — ED Notes (Signed)
D/c instructions given at length voiced understanding. Ice pack provided. Pt has her Incentive spirometry and voices understanding.

## 2020-12-14 NOTE — ED Provider Notes (Signed)
[facility] EMERGENCY DEPT Provider Note   CSN: [id number] Arrival date & time: 12/14/20  2021     History Chief Complaint  Patient presents with   Chest Pain    Kim Sparks is a 45 y.o. female.   Chest Pain Associated symptoms: no abdominal pain, no back pain, no cough, no fever, no palpitations, no shortness of breath and no vomiting    Patient is a 45 year old female presenting to the emergency department after a fall down 4 stairs on Tuesday.  She states that she initially felt no pain or injury after the fall.  She states that she grabbed the railing of the stair and may have struck her chest on the railing on the way down.  Over the past few days she has developed worsening shortness of breath and chest discomfort.  She states that it hurts to take a deep breath.  She denies any other injuries or complaints.  She arrived GCS 15, ABC intact.  History reviewed. No pertinent past medical history.  There are no problems to display for this patient.   Past Surgical History:  Procedure Laterality Date   MOUTH SURGERY       OB History     Gravida      Para      Term      Preterm      AB      Living  2      SAB      IAB      Ectopic      Multiple      Live Births              No family history on file.  Social History   Tobacco Use   Smoking status: Never    Passive exposure: Never   Smokeless tobacco: Never  Vaping Use   Vaping Use: Never used  Substance Use Topics   Alcohol use: Yes    Comment: occ   Drug use: No    Home Medications Prior to Admission medications   Medication Sig Start Date End Date Taking? Authorizing Provider  ciprofloxacin (CIPRO) 500 MG tablet Take 500 mg by mouth 2 (two) times daily. For 5 days started on 10-05-2013 10/05/13   [provider]  doxycycline (VIBRA-TABS) 100 MG tablet Take 1 tablet (100 mg total) by mouth 2 (two) times daily. 04/28/20   Felecia Shelling, DPM  escitalopram  (LEXAPRO) 5 MG tablet Take 5 mg by mouth at bedtime.    [provider]  gentamicin cream (GARAMYCIN) 0.1 % Apply 1 application topically 2 (two) times daily. 04/28/20   Felecia Shelling, DPM  HYDROcodone-acetaminophen (NORCO/VICODIN) 5-325 MG tablet Take 1-2 tablets by mouth every 6 (six) hours as needed for up to 3 days for severe pain. 12/14/20 12/17/20  Ernie Avena, MD  lactobacillus acidophilus & bulgar (LACTINEX) chewable tablet Chew 1 tablet by mouth 3 (three) times daily with meals. 10/07/13   Linwood Dibbles, MD  metroNIDAZOLE (FLAGYL) 500 MG tablet Take 1 tablet (500 mg total) by mouth 2 (two) times daily. 10/07/13   Linwood Dibbles, MD    Allergies    Penicillins  Review of Systems   Review of Systems  Constitutional:  Negative for chills and fever.  HENT:  Negative for ear pain and sore throat.   Eyes:  Negative for pain and visual disturbance.  Respiratory:  Negative for cough and shortness of breath.   Cardiovascular:  Positive for chest  pain. Negative for palpitations.  Gastrointestinal:  Negative for abdominal pain and vomiting.  Genitourinary:  Negative for dysuria and hematuria.  Musculoskeletal:  Negative for arthralgias and back pain.  Skin:  Negative for color change and rash.  Neurological:  Negative for seizures and syncope.  All other systems reviewed and are negative.  Physical Exam Updated Vital Signs BP 121/83   Pulse 61   Temp 98.3 F (36.8 C)   Resp 18   Ht 5\' 3"  (1.6 m)   Wt 54.4 kg   LMP 12/08/2020 (Approximate)   SpO2 99%   BMI 21.26 kg/m   Physical Exam Vitals and nursing note reviewed.  Constitutional:      General: She is not in acute distress.    Appearance: She is well-developed.     Comments: GCS 15, ABC intact  HENT:     Head: Normocephalic and atraumatic.  Eyes:     Extraocular Movements: Extraocular movements intact.     Conjunctiva/sclera: Conjunctivae normal.     Pupils: Pupils are equal, round, and reactive to light.  Neck:      Comments: No midline tenderness to palpation of the cervical spine.  Range of motion intact Cardiovascular:     Rate and Rhythm: Normal rate and regular rhythm.     Heart sounds: No murmur heard. Pulmonary:     Effort: Pulmonary effort is normal. No respiratory distress.     Breath sounds: Normal breath sounds.  Chest:     Comments: Clavicles stable nontender to AP compression.  Chest wall tenderness palpation, specifically pain on anterior palpation of the sternum Abdominal:     Palpations: Abdomen is soft.     Tenderness: There is no abdominal tenderness.  Musculoskeletal:     Cervical back: Neck supple.     Comments: No midline tenderness to palpation of the thoracic or lumbar spine.  Extremities atraumatic with intact range of motion  Skin:    General: Skin is warm and dry.  Neurological:     Mental Status: She is alert.     Comments: Cranial nerves II through XII grossly intact.  Moving all 4 extremities spontaneously.  Sensation grossly intact all 4 extremities    ED Results / Procedures / Treatments   Labs (all labs ordered are listed, but only abnormal results are displayed) Labs Reviewed  BASIC METABOLIC PANEL - Abnormal; Notable for the following components:      Result Value   Calcium 8.5 (*)    All other components within normal limits  CBC  PREGNANCY, URINE  TROPONIN I (HIGH SENSITIVITY)  TROPONIN I (HIGH SENSITIVITY)    EKG EKG Interpretation  Date/Time:  Sunday December 14 2020 20:28:00 EDT Ventricular Rate:  65 PR Interval:  148 QRS Duration: 84 QT Interval:  400 QTC Calculation: 416 R Axis:   83 Text Interpretation: Normal sinus rhythm Biatrial enlargement Abnormal ECG Confirmed by [id number] ([id number]) on 12/14/2020 8:40:11 PM  Radiology CT CHEST W CONTRAST  Result Date: 12/14/2020 CLINICAL DATA:  Rib fracture suspected, traumatic Chest trauma, mod-severe Poss sternal fx. Fall downstairs. EXAM: CT CHEST WITH CONTRAST TECHNIQUE: Multidetector CT  imaging of the chest was performed during intravenous contrast administration. CONTRAST:  62mL OMNIPAQUE IOHEXOL 350 MG/ML SOLN COMPARISON:  None. FINDINGS: Cardiovascular: Heart is normal size. Aorta is normal caliber. Mediastinum/Nodes: No mediastinal, hilar, or axillary adenopathy. Trachea and esophagus are unremarkable. Thyroid unremarkable. Lungs/Pleura: Lungs are clear. No focal airspace opacities or suspicious nodules. No effusions. Upper Abdomen: Imaging into  the upper abdomen demonstrates no acute findings. Musculoskeletal: Chest wall soft tissues are unremarkable. There is buckling of the anterior cortex in the manubrium compatible with fracture. No thoracic or rib fracture noted. IMPRESSION: Nondisplaced manubrial fracture. No visible rib fracture or pneumothorax. Electronically Signed   By: Charlett Nose M.D.   On: 12/14/2020 23:05    Procedures Procedures   Medications Ordered in ED Medications  iohexol (OMNIPAQUE) 350 MG/ML injection 60 mL (60 mLs Intravenous Contrast Given 12/14/20 2249)    ED Course  I have reviewed the triage vital signs and the nursing notes.  Pertinent labs & imaging results that were available during my care of the patient were reviewed by me and considered in my medical decision making (see chart for details).    MDM Rules/Calculators/A&P                           45 year old female presenting to the emergency department with chest pain after a fall downstairs several days ago.  No head trauma or loss of consciousness.  She did not seek medical care after the incident.  She arrived GCS 15, ABC intact.  She endorses worsening chest pain and shortness of breath.  She has tenderness of the sternum on physical exam with no other evidence of injury on primary secondary survey.  EKG revealed no ischemic changes.  Troponins x2 were negative.  CBC was without a leukocytosis or anemia.  CT of the chest did reveal a nondisplaced fracture of the manubrium.  No evidence of  focal consolidation or other acute cardiac or pulmonary abnormality.  The patient was provided with an incentive spirometer and prescribed a course of Norco for pain control.  She was advised to follow-up with her PCP.  Final Clinical Impression(s) / ED Diagnoses Final diagnoses:  Closed fracture of manubrium, initial encounter    Rx / DC Orders ED Discharge Orders          Ordered    HYDROcodone-acetaminophen (NORCO/VICODIN) 5-325 MG tablet  Every 6 hours PRN,   Status:  Discontinued        12/14/20 2310    HYDROcodone-acetaminophen (NORCO/VICODIN) 5-325 MG tablet  Every 6 hours PRN,   Status:  Discontinued        12/14/20 2319    HYDROcodone-acetaminophen (NORCO/VICODIN) 5-325 MG tablet  Every 6 hours PRN        12/14/20 2330             Ernie Avena, MD 12/14/20 2333

## 2020-12-14 NOTE — Discharge Instructions (Addendum)
Your CT imaging was concerning for a nondisplaced fracture of your manubrium.  I prescribed pain medication to your pharmacy on file at [facility] in [place].  No other fractures of your ribs were identified.  You will likely have pain on inspiration and are at risk for developing pneumonia.  Will be important to do your best to take deep breaths over the next few days to continue to inflate your lungs return for repeat evaluation in the event of fever, chills, worsening shortness of breath and chest pain.

## 2020-12-14 NOTE — ED Triage Notes (Signed)
Patient reports to the ER after falling down the stairs on Tuesday. Patient states her chest has began to hurt worse since the incident and today it feels as if it is radiating up her neck and she feels ShOB.

## 2020-12-26 DIAGNOSIS — Z30011 Encounter for initial prescription of contraceptive pills: Secondary | ICD-10-CM | POA: Diagnosis not present

## 2020-12-26 DIAGNOSIS — F329 Major depressive disorder, single episode, unspecified: Secondary | ICD-10-CM | POA: Diagnosis not present

## 2021-01-14 DIAGNOSIS — F5081 Binge eating disorder: Secondary | ICD-10-CM | POA: Diagnosis not present

## 2021-01-29 ENCOUNTER — Other Ambulatory Visit (HOSPITAL_COMMUNITY): Payer: Self-pay | Admitting: Family Medicine

## 2021-01-29 DIAGNOSIS — M25552 Pain in left hip: Secondary | ICD-10-CM

## 2021-02-03 ENCOUNTER — Other Ambulatory Visit (HOSPITAL_COMMUNITY): Payer: Self-pay | Admitting: Family Medicine

## 2021-02-03 DIAGNOSIS — M25552 Pain in left hip: Secondary | ICD-10-CM

## 2021-02-04 DIAGNOSIS — M25552 Pain in left hip: Secondary | ICD-10-CM | POA: Diagnosis not present

## 2021-02-05 DIAGNOSIS — M25552 Pain in left hip: Secondary | ICD-10-CM | POA: Diagnosis not present

## 2021-02-11 ENCOUNTER — Ambulatory Visit (HOSPITAL_COMMUNITY): Payer: BC Managed Care – PPO | Attending: Family Medicine

## 2021-02-11 ENCOUNTER — Encounter (HOSPITAL_COMMUNITY): Payer: Self-pay

## 2021-02-16 DIAGNOSIS — F5081 Binge eating disorder: Secondary | ICD-10-CM | POA: Diagnosis not present

## 2021-02-16 DIAGNOSIS — F419 Anxiety disorder, unspecified: Secondary | ICD-10-CM | POA: Diagnosis not present

## 2021-02-18 DIAGNOSIS — M25552 Pain in left hip: Secondary | ICD-10-CM | POA: Diagnosis not present

## 2021-02-25 DIAGNOSIS — F411 Generalized anxiety disorder: Secondary | ICD-10-CM | POA: Diagnosis not present

## 2021-02-25 DIAGNOSIS — F1010 Alcohol abuse, uncomplicated: Secondary | ICD-10-CM | POA: Diagnosis not present

## 2021-05-15 ENCOUNTER — Other Ambulatory Visit: Payer: Self-pay | Admitting: Family Medicine

## 2021-05-15 ENCOUNTER — Ambulatory Visit
Admission: RE | Admit: 2021-05-15 | Discharge: 2021-05-15 | Disposition: A | Discharge status: Home / Self Care | Payer: BC Managed Care – PPO | Source: Ambulatory Visit | Attending: Family Medicine | Admitting: Family Medicine

## 2021-05-15 DIAGNOSIS — M25552 Pain in left hip: Secondary | ICD-10-CM

## 2021-05-21 ENCOUNTER — Other Ambulatory Visit: Payer: Self-pay | Admitting: Orthopedic Surgery

## 2021-05-21 NOTE — Progress Notes (Signed)
Sent message, via epic in basket, requesting orders in epic from surgeon.  

## 2021-05-22 NOTE — Progress Notes (Signed)
Your procedure is scheduled on:       05/26/21   Report to [facility]  Entrance   Report to admitting at   1115AM     Call this number if you have problems the morning of surgery [phone]    REMEMBER: NO  SOLID FOOD CANDY OR GUM AFTER MIDNIGHT. CLEAR LIQUIDS UNTIL     1030am      . NOTHING BY MOUTH EXCEPT CLEAR LIQUIDS UNTIL 1030am    . PLEASE FINISH ENSURE DRINK PER SURGEON ORDER  WHICH NEEDS TO BE COMPLETED AT  1030am     .      CLEAR LIQUID DIET   Foods Allowed                                                                    Coffee and tea, regular and decaf                            Fruit ices (not with fruit pulp)                                      Iced Popsicles                                    Carbonated beverages, regular and diet                                    Cranberry, grape and apple juices Sports drinks like Gatorade Lightly seasoned clear broth or consume(fat free) Sugar, honey syrup ___________________________________________________________________      BRUSH YOUR TEETH MORNING OF SURGERY AND RINSE YOUR MOUTH OUT, NO CHEWING GUM CANDY OR MINTS.     Take these medicines the morning of surgery with A SIP OF WATER:  none   DO NOT TAKE ANY DIABETIC MEDICATIONS DAY OF YOUR SURGERY                               You may not have any metal on your body including hair pins and              piercings  Do not wear jewelry, make-up, lotions, powders or perfumes, deodorant             Do not wear nail polish on your fingernails.  Do not shave  48 hours prior to surgery.              Men may shave face and neck.   Do not bring valuables to the hospital. [facility] IS NOT             RESPONSIBLE   FOR VALUABLES.  Contacts, dentures or bridgework may not be worn into surgery.  Leave suitcase in the car. After surgery it may be brought to your room.     Patients discharged the day of surgery will not  be allowed to drive  home. IF YOU ARE HAVING SURGERY AND GOING HOME THE SAME DAY, YOU MUST HAVE AN ADULT TO DRIVE YOU HOME AND BE WITH YOU FOR 24 HOURS. YOU MAY GO HOME BY TAXI OR UBER OR ORTHERWISE, BUT AN ADULT MUST ACCOMPANY YOU HOME AND STAY WITH YOU FOR 24 HOURS.  Name and phone number of your driver:  Special Instructions: N/A              Please read over the following fact sheets you were given: _____________________________________________________________________  [facility] - Preparing for Surgery Before surgery, you can play an important role.  Because skin is not sterile, your skin needs to be as free of germs as possible.  You can reduce the number of germs on your skin by washing with CHG (chlorahexidine gluconate) soap before surgery.  CHG is an antiseptic cleaner which kills germs and bonds with the skin to continue killing germs even after washing. Please DO NOT use if you have an allergy to CHG or antibacterial soaps.  If your skin becomes reddened/irritated stop using the CHG and inform your nurse when you arrive at Short Stay. Do not shave (including legs and underarms) for at least 48 hours prior to the first CHG shower.  You may shave your face/neck. Please follow these instructions carefully:  1.  Shower with CHG Soap the night before surgery and the  morning of Surgery.  2.  If you choose to wash your hair, wash your hair first as usual with your  normal  shampoo.  3.  After you shampoo, rinse your hair and body thoroughly to remove the  shampoo.                           4.  Use CHG as you would any other liquid soap.  You can apply chg directly  to the skin and wash                       Gently with a scrungie or clean washcloth.  5.  Apply the CHG Soap to your body ONLY FROM THE NECK DOWN.   Do not use on face/ open                           Wound or open sores. Avoid contact with eyes, ears mouth and genitals (private parts).                       Wash face,  Genitals (private parts) with  your normal soap.             6.  Wash thoroughly, paying special attention to the area where your surgery  will be performed.  7.  Thoroughly rinse your body with warm water from the neck down.  8.  DO NOT shower/wash with your normal soap after using and rinsing off  the CHG Soap.                9.  Pat yourself dry with a clean towel.            10.  Wear clean pajamas.            11.  Place clean sheets on your bed the night of your first shower and do not  sleep with pets. Day of Surgery : Do not apply  any lotions/deodorants the morning of surgery.  Please wear clean clothes to the hospital/surgery center.  FAILURE TO FOLLOW THESE INSTRUCTIONS MAY RESULT IN THE CANCELLATION OF YOUR SURGERY PATIENT SIGNATURE_________________________________  NURSE SIGNATURE__________________________________  ________________________________________________________________________

## 2021-05-22 NOTE — Progress Notes (Addendum)
Anesthesia Review:  PCP:  [facility]  Cardiologist : none  Chest x-ray : Ct chest-12/14/20  EKG :12/17/20  Echo : Stress test: Cardiac Cath :  Activity level: can do a flight of stairs without difficulty  Sleep Study/ CPAP : none  Fasting Blood Sugar :      / Checks Blood Sugar -- times a day:   Blood Thinner/ Instructions /Last Dose: ASA / Instructions/ Last Dose :   No covid test - ambulatory surgery  PT was 15 minutes late for preop appt.

## 2021-05-25 ENCOUNTER — Other Ambulatory Visit: Payer: Self-pay

## 2021-05-25 ENCOUNTER — Encounter (HOSPITAL_COMMUNITY): Payer: Self-pay

## 2021-05-25 ENCOUNTER — Encounter (HOSPITAL_COMMUNITY)
Admission: RE | Admit: 2021-05-25 | Discharge: 2021-05-25 | Disposition: A | Discharge status: Home / Self Care | Payer: BC Managed Care – PPO | Source: Ambulatory Visit | Attending: Orthopedic Surgery | Admitting: Orthopedic Surgery

## 2021-05-25 VITALS — BP 122/88 | HR 68 | Temp 98.6°F | Resp 16 | Ht 63.0 in | Wt 123.0 lb

## 2021-05-25 DIAGNOSIS — M84359A Stress fracture, hip, unspecified, initial encounter for fracture: Secondary | ICD-10-CM | POA: Diagnosis not present

## 2021-05-25 DIAGNOSIS — Z01812 Encounter for preprocedural laboratory examination: Secondary | ICD-10-CM | POA: Insufficient documentation

## 2021-05-25 DIAGNOSIS — Z01818 Encounter for other preprocedural examination: Secondary | ICD-10-CM

## 2021-05-25 DIAGNOSIS — X58XXXA Exposure to other specified factors, initial encounter: Secondary | ICD-10-CM | POA: Diagnosis not present

## 2021-05-25 LAB — CBC
HCT: 38.2 % (ref 36.0–46.0)
Hemoglobin: 12.7 g/dL (ref 12.0–15.0)
MCH: 32 pg (ref 26.0–34.0)
MCHC: 33.2 g/dL (ref 30.0–36.0)
MCV: 96.2 fL (ref 80.0–100.0)
Platelets: 316 10*3/uL (ref 150–400)
RBC: 3.97 MIL/uL (ref 3.87–5.11)
RDW: 12.6 % (ref 11.5–15.5)
WBC: 5.6 10*3/uL (ref 4.0–10.5)
nRBC: 0 % (ref 0.0–0.2)

## 2021-05-25 NOTE — Progress Notes (Signed)
Pt aware of surgical time change for scheduled surgery on Tuesday 05/26/2021. Pt to arrive at [facility] admitting at 10:45 am. No food after midnight; clear liquids from midnight till 0930 then nothing by mouth. Please consume entire pre surgery drink by 0930 then nothing by mouth.

## 2021-05-26 ENCOUNTER — Ambulatory Visit (HOSPITAL_COMMUNITY): Payer: BC Managed Care – PPO | Admitting: Certified Registered Nurse Anesthetist

## 2021-05-26 ENCOUNTER — Ambulatory Visit (HOSPITAL_COMMUNITY): Payer: BC Managed Care – PPO

## 2021-05-26 ENCOUNTER — Encounter (HOSPITAL_COMMUNITY): Payer: Self-pay | Admitting: Orthopedic Surgery

## 2021-05-26 ENCOUNTER — Encounter (HOSPITAL_COMMUNITY)
Admission: RE | Disposition: A | Discharge status: Home / Self Care | Payer: Self-pay | Source: Ambulatory Visit | Attending: Orthopedic Surgery

## 2021-05-26 ENCOUNTER — Ambulatory Visit (HOSPITAL_COMMUNITY)
Admission: RE | Admit: 2021-05-26 | Discharge: 2021-05-26 | Disposition: A | Discharge status: Home / Self Care | Payer: BC Managed Care – PPO | Source: Ambulatory Visit | Attending: Orthopedic Surgery | Admitting: Orthopedic Surgery

## 2021-05-26 ENCOUNTER — Other Ambulatory Visit: Payer: Self-pay

## 2021-05-26 DIAGNOSIS — M84359A Stress fracture, hip, unspecified, initial encounter for fracture: Secondary | ICD-10-CM | POA: Insufficient documentation

## 2021-05-26 DIAGNOSIS — S72009A Fracture of unspecified part of neck of unspecified femur, initial encounter for closed fracture: Secondary | ICD-10-CM

## 2021-05-26 DIAGNOSIS — Z01818 Encounter for other preprocedural examination: Secondary | ICD-10-CM

## 2021-05-26 DIAGNOSIS — X58XXXA Exposure to other specified factors, initial encounter: Secondary | ICD-10-CM | POA: Insufficient documentation

## 2021-05-26 DIAGNOSIS — Z09 Encounter for follow-up examination after completed treatment for conditions other than malignant neoplasm: Secondary | ICD-10-CM

## 2021-05-26 HISTORY — PX: FEMUR IM NAIL: SHX1597

## 2021-05-26 LAB — PREGNANCY, URINE: Preg Test, Ur: NEGATIVE

## 2021-05-26 SURGERY — INSERTION, INTRAMEDULLARY ROD, FEMUR
Anesthesia: General | Site: Hip | Laterality: Left

## 2021-05-26 MED ORDER — HYDROMORPHONE HCL 1 MG/ML IJ SOLN
INTRAMUSCULAR | Status: AC
  Filled 2021-05-26: qty 1

## 2021-05-26 MED ORDER — LIDOCAINE HCL (PF) 2 % IJ SOLN
INTRAMUSCULAR | Status: AC
  Filled 2021-05-26: qty 5

## 2021-05-26 MED ORDER — FENTANYL CITRATE (PF) 250 MCG/5ML IJ SOLN
INTRAMUSCULAR | Status: AC
  Filled 2021-05-26: qty 5

## 2021-05-26 MED ORDER — SUGAMMADEX SODIUM 200 MG/2ML IV SOLN
INTRAVENOUS | Status: DC | PRN
  Administered 2021-05-26: 111.6 mg via INTRAVENOUS

## 2021-05-26 MED ORDER — ORAL CARE MOUTH RINSE: 15.0000 mL | Freq: Once | OROMUCOSAL | Status: AC

## 2021-05-26 MED ORDER — LACTATED RINGERS IV SOLN: INTRAVENOUS | Status: DC

## 2021-05-26 MED ORDER — DEXAMETHASONE SODIUM PHOSPHATE 10 MG/ML IJ SOLN
INTRAMUSCULAR | Status: AC
  Filled 2021-05-26: qty 1

## 2021-05-26 MED ORDER — LIDOCAINE 2% (20 MG/ML) 5 ML SYRINGE
INTRAMUSCULAR | Status: DC | PRN
  Administered 2021-05-26: 100 mg via INTRAVENOUS

## 2021-05-26 MED ORDER — HYDROMORPHONE HCL 1 MG/ML IJ SOLN
0.2500 mg | INTRAMUSCULAR | Status: DC | PRN
  Administered 2021-05-26 (×4): 0.5 mg via INTRAVENOUS

## 2021-05-26 MED ORDER — PHENYLEPHRINE HCL (PRESSORS) 10 MG/ML IV SOLN
INTRAVENOUS | Status: AC
  Filled 2021-05-26: qty 1

## 2021-05-26 MED ORDER — FENTANYL CITRATE (PF) 100 MCG/2ML IJ SOLN
INTRAMUSCULAR | Status: DC | PRN
  Administered 2021-05-26 (×3): 50 ug via INTRAVENOUS
  Administered 2021-05-26: 100 ug via INTRAVENOUS

## 2021-05-26 MED ORDER — CHLORHEXIDINE GLUCONATE 0.12 % MT SOLN
15.0000 mL | Freq: Once | OROMUCOSAL | Status: AC
  Administered 2021-05-26: 15 mL via OROMUCOSAL

## 2021-05-26 MED ORDER — VANCOMYCIN HCL 1000 MG IV SOLR
INTRAVENOUS | Status: DC | PRN
  Administered 2021-05-26: 1000 mg via TOPICAL

## 2021-05-26 MED ORDER — BUPIVACAINE-EPINEPHRINE (PF) 0.25% -1:200000 IJ SOLN
INTRAMUSCULAR | Status: AC
  Filled 2021-05-26: qty 30

## 2021-05-26 MED ORDER — PROMETHAZINE HCL 12.5 MG PO TABS
12.5000 mg | ORAL_TABLET | Freq: Four times a day (QID) | ORAL | 0 refills | Status: AC | PRN
Start: 2021-05-26 — End: ?

## 2021-05-26 MED ORDER — ONDANSETRON HCL 4 MG/2ML IJ SOLN
INTRAMUSCULAR | Status: DC | PRN
  Administered 2021-05-26: 4 mg via INTRAVENOUS

## 2021-05-26 MED ORDER — VANCOMYCIN HCL 1000 MG IV SOLR
INTRAVENOUS | Status: AC
  Filled 2021-05-26: qty 20

## 2021-05-26 MED ORDER — PROPOFOL 10 MG/ML IV BOLUS
INTRAVENOUS | Status: DC | PRN
  Administered 2021-05-26: 130 mg via INTRAVENOUS

## 2021-05-26 MED ORDER — OXYCODONE HCL 5 MG PO TABS: 5.0000 mg | ORAL_TABLET | Freq: Four times a day (QID) | ORAL | 0 refills | Status: AC | PRN

## 2021-05-26 MED ORDER — ONDANSETRON HCL 4 MG/2ML IJ SOLN
INTRAMUSCULAR | Status: AC
  Filled 2021-05-26: qty 2

## 2021-05-26 MED ORDER — PHENYLEPHRINE 40 MCG/ML (10ML) SYRINGE FOR IV PUSH (FOR BLOOD PRESSURE SUPPORT)
PREFILLED_SYRINGE | INTRAVENOUS | Status: AC
  Filled 2021-05-26: qty 10

## 2021-05-26 MED ORDER — ROCURONIUM BROMIDE 10 MG/ML (PF) SYRINGE
PREFILLED_SYRINGE | INTRAVENOUS | Status: DC | PRN
  Administered 2021-05-26: 50 mg via INTRAVENOUS

## 2021-05-26 MED ORDER — PROPOFOL 10 MG/ML IV BOLUS
INTRAVENOUS | Status: AC
  Filled 2021-05-26: qty 20

## 2021-05-26 MED ORDER — 0.9 % SODIUM CHLORIDE (POUR BTL) OPTIME
TOPICAL | Status: DC | PRN
  Administered 2021-05-26: 1000 mL

## 2021-05-26 MED ORDER — PHENYLEPHRINE 40 MCG/ML (10ML) SYRINGE FOR IV PUSH (FOR BLOOD PRESSURE SUPPORT)
PREFILLED_SYRINGE | INTRAVENOUS | Status: DC | PRN
  Administered 2021-05-26: 80 ug via INTRAVENOUS

## 2021-05-26 MED ORDER — BUPIVACAINE-EPINEPHRINE (PF) 0.25% -1:200000 IJ SOLN
INTRAMUSCULAR | Status: DC | PRN
  Administered 2021-05-26: 30 mL

## 2021-05-26 MED ORDER — VANCOMYCIN HCL IN DEXTROSE 1-5 GM/200ML-% IV SOLN
1000.0000 mg | INTRAVENOUS | Status: AC
  Administered 2021-05-26: 1000 mg via INTRAVENOUS
  Filled 2021-05-26: qty 200

## 2021-05-26 MED ORDER — EPHEDRINE SULFATE-NACL 50-0.9 MG/10ML-% IV SOSY
PREFILLED_SYRINGE | INTRAVENOUS | Status: DC | PRN
  Administered 2021-05-26: 10 mg via INTRAVENOUS
  Administered 2021-05-26: 5 mg via INTRAVENOUS

## 2021-05-26 MED ORDER — MIDAZOLAM HCL 2 MG/2ML IJ SOLN
INTRAMUSCULAR | Status: AC
  Filled 2021-05-26: qty 2

## 2021-05-26 MED ORDER — DEXAMETHASONE SODIUM PHOSPHATE 10 MG/ML IJ SOLN
INTRAMUSCULAR | Status: DC | PRN
  Administered 2021-05-26: 8 mg via INTRAVENOUS

## 2021-05-26 MED ORDER — EPHEDRINE 5 MG/ML INJ
INTRAVENOUS | Status: AC
  Filled 2021-05-26: qty 5

## 2021-05-26 MED ORDER — MIDAZOLAM HCL 5 MG/5ML IJ SOLN
INTRAMUSCULAR | Status: DC | PRN
  Administered 2021-05-26: 2 mg via INTRAVENOUS

## 2021-05-26 SURGICAL SUPPLY — 44 items
BAG COUNTER SPONGE SURGICOUNT (BAG) ×1 IMPLANT
BAG ZIPLOCK 12X15 (MISCELLANEOUS) ×1 IMPLANT
BIT DRILL 4.0X165 AO STYLE (BIT) ×1 IMPLANT
BIT DRILL 4.0X280 (BIT) ×1 IMPLANT
BLADE SURG 15 STRL LF DISP TIS (BLADE) ×1 IMPLANT
BLADE SURG 15 STRL SS (BLADE)
BNDG COHESIVE 4X5 TAN ST LF (GAUZE/BANDAGES/DRESSINGS) ×1 IMPLANT
BNDG GAUZE ELAST 4 BULKY (GAUZE/BANDAGES/DRESSINGS) ×1 IMPLANT
CHLORAPREP W/TINT 26 (MISCELLANEOUS) ×1 IMPLANT
COVER SURGICAL LIGHT HANDLE (MISCELLANEOUS) ×2 IMPLANT
DERMABOND ADVANCED (GAUZE/BANDAGES/DRESSINGS) ×1
DERMABOND ADVANCED .7 DNX12 (GAUZE/BANDAGES/DRESSINGS) IMPLANT
DRAPE C-ARM 42X120 X-RAY (DRAPES) ×1 IMPLANT
DRAPE C-ARMOR (DRAPES) ×1 IMPLANT
DRAPE STERI IOBAN 125X83 (DRAPES) ×2 IMPLANT
DRSG AQUACEL AG ADV 3.5X 4 (GAUZE/BANDAGES/DRESSINGS) ×2 IMPLANT
DRSG AQUACEL AG ADV 3.5X14 (GAUZE/BANDAGES/DRESSINGS) ×1 IMPLANT
DURAPREP 26ML APPLICATOR (WOUND CARE) ×2 IMPLANT
ELECT REM PT RETURN 15FT ADLT (MISCELLANEOUS) ×2 IMPLANT
FACESHIELD WRAPAROUND (MASK) ×2 IMPLANT
FACESHIELD WRAPAROUND OR TEAM (MASK) IMPLANT
GLOVE SRG 8 PF TXTR STRL LF DI (GLOVE) ×1 IMPLANT
GLOVE SURG ENC MOIS LTX SZ7.5 (GLOVE) ×2 IMPLANT
GLOVE SURG UNDER POLY LF SZ8 (GLOVE) ×2
GOWN STRL REUS W/TWL XL LVL3 (GOWN DISPOSABLE) ×2 IMPLANT
GUIDEWIRE BALL NOSE 3.0X900 (WIRE) ×4
GUIDEWIRE ORTH 900X3XBALL NOSE (WIRE) IMPLANT
KIT BASIN OR (CUSTOM PROCEDURE TRAY) ×2 IMPLANT
KIT TURNOVER KIT A (KITS) ×1 IMPLANT
MANIFOLD NEPTUNE II (INSTRUMENTS) ×2 IMPLANT
NAIL 9X130X20 SHORT (Nail) ×1 IMPLANT
NS IRRIG 1000ML POUR BTL (IV SOLUTION) ×1 IMPLANT
PACK GENERAL/GYN (CUSTOM PROCEDURE TRAY) ×2 IMPLANT
PIN GUIDE THRD AR 3.2X330 (PIN) ×1 IMPLANT
PROTECTOR NERVE ULNAR (MISCELLANEOUS) ×1 IMPLANT
SCREW LOCK CORT 5.0X30 (Screw) ×1 IMPLANT
SCREW LOCK LAG FEM 10.5X90 (Screw) ×1 IMPLANT
SPONGE T-LAP 18X18 ~~LOC~~+RFID (SPONGE) ×1 IMPLANT
STAPLER VISISTAT 35W (STAPLE) ×1 IMPLANT
SUT VIC AB 0 CT1 27 (SUTURE) ×2
SUT VIC AB 0 CT1 27XBRD ANTBC (SUTURE) IMPLANT
SUT VIC AB 2-0 CT1 27 (SUTURE) ×2
SUT VIC AB 2-0 CT1 TAPERPNT 27 (SUTURE) ×1 IMPLANT
TOOL ACTIVATION (INSTRUMENTS) ×1 IMPLANT

## 2021-05-26 NOTE — Op Note (Signed)
OPERATIVE NOTE  Kim Sparks female 46 y.o. 05/26/2021  PREOPERATIVE DIAGNOSIS: Left hip intertrochanteric stress fracture  POSTOPERATIVE DIAGNOSIS: Left hip intertrochanteric stress fracture  PROCEDURE(S): Open treatment of left hip intertrochanteric stress fracture with cephalomedullary nail implant (70017) Operative use of fluoroscopy for above procedure(s) (49449)  SURGEON: Ernestina Columbia, M.D.  ASSISTANT(S): None  ANESTHESIA: General  FINDINGS: Preoperative Examination: Minimal groin pain with logroll.  Pain in trochanteric/intertrochanteric area.  Able to straight leg raise against resistance with negative Stinchfield test.  Tender to palpation deeply about the trochanteric area of the femur.  Intact dorsiflexion, plantarflexion, and EHL against resistance.  Sensation intact to light touch in the superficial peroneal, deep peroneal, and tibial distributions.  Normal distal pulses.  Warm well-perfused distally.  Operative Findings: No displaced fracture.  Appropriate intramedullary placement of cephalomedullary nail implant.  IMPLANTS: Implant Name Type Inv. Item Serial No. Manufacturer Lot No. LRB No. Used Action  NAIL [id number] SHORT - [id number] Nail NAIL [id number] SHORT  [employer]  Left 1 Implanted  SCREW LOCK LAG FEM 10.5X90 - [id number] Screw SCREW LOCK LAG FEM 10.5X90  [employer]  Left 1 Implanted  SCREW LOCK CORT 5.0X30 - [id number] Screw SCREW LOCK CORT 5.0X30  [employer]  Left 1 Implanted    INDICATIONS:  The patient is a 46 y.o. female who initially had an MRI back in October 2022 demonstrating an intertrochanteric stress fracture.  She was recommended that time to undergo nonoperative treatment with restricted weightbearing and activity modification.  She did experience a brief period of resolution of her symptoms, but unfortunately her pain returned.  A CT was obtained which demonstrated new bony hypertrophy medially along with femoral neck.  This was  interpreted as concerning for evolving stress reaction or stress fracture involving the femoral neck.  Given these findings, recurrent pain with failure of nonoperative treatment, and the MRI demonstrating intertrochanteric extension, she was recommended to undergo prophylactic fixation with a short cephalomedullary nail implant to stabilize both the femoral neck and intertrochanteric region.  She understood the risks, benefits and alternatives to surgery which include but are not limited to bleeding, wound healing complications, infection, damage to surrounding structures, persistent pain, stiffness, lack of improvement, potential for subsequent arthritis or worsening of pre-existing arthritis, nonunion, malunion, and need for further surgery, as well as complications related to anesthesia, cardiovascular complications, and death.  She also understood the potential for continued pain in that there were no guarantees of acceptable outcome.  After weighing these risks the patient opted to proceed with surgery.  TECHNIQUE: Patient was identified in the preoperative holding area.  The left hip and thigh was marked by myself.  Consent was signed by myself and the patient.  No block was performed by anesthesia in the preoperative holding area.  Patient was taken to the operative suite and placed supine on the operative table.  Anesthesia was induced by the anesthesia team.  The patient was positioned appropriately for the procedure and all bony prominences were well padded.  A tourniquet was not used.  Preoperative antibiotics were given. The extremity was prepped and draped in the usual sterile fashion and surgical timeout was performed.  Small curvilinear incision extending proximally from the tip the greater trochanter was marked out on skin.  Skin was incised sharply.  Underlying subcutaneous fat tissues were dissected down onto the fascial layer with Bovie electrocautery.  Fascia was incised sharply in line with  the fibers proximally from the tip of the greater trochanter.  Trochanteric starting point was identified fluoroscopically.  Starting wire was placed at the trochanteric starting point and advanced into the intramedullary canal of the femur.  Appropriate intramedullary position was confirmed on orthogonal AP and lateral views fluoroscopically.  With the starting wire advanced to an appropriate depth, the femur was cannulated at the trochanteric entry site with the entry reamer.  This entry reamer and starting wire were withdrawn.  Ball-tipped guidewire was advanced and center center position down the intramedullary canal, confirmed with orthogonal AP and lateral fluoroscopic views.  Successive reaming was carried out of the femoral canal up to 10.5 mm diameter to accommodate a 9 mm nail.  A 9 mm short cephalomedullary nail was impacted into an appropriate position over the ball-tipped guidewire.  Ball-tipped guidewire was withdrawn.  Lateral entry point for the head screw was identified on the skin.  Skin was incised sharply, followed by sharp dissection through the underlying fat and fascia and muscle down onto the lateral femoral cortex.  The triple sleeve trocar for the head screw was advanced down onto the lateral cortex of the femur, and guidewire for the screw was advanced up proximally into the head, taking care to avoid subchondral penetration.  Screw measurement was obtained off the guidewire, and the path for the screw was drilled.  A 90 mm screw was advanced into position over the guidewire, the guidewire and trocar were withdrawn.  The entry point for the distal interlock screw was identified laterally on the skin.  Skin was incised sharply, along with the underlying fat, fascia, and muscle, to expose the lateral femoral cortex distally.  The triple sleeve for the distal interlocking screw was advanced and positioned on the lateral femoral cortex.  Path for the interlock screw was drilled through the  sleeve and screws placed through the sleeve.  All instrumentation was withdrawn.  Wounds were copiously irrigated and hemostasis was obtained.  Wounds were closed in layers.  Fascial layer was closed with #1 interrupted figure-of-eight PDS.  The fat was then approximated with inverted simple 0 Vicryl stitches.  Deep dermal was closed with simple inverted interrupted 2-0 Monocryl, followed by running 3-0 Monocryl subcuticular.  Skin was sealed with Dermabond.  Large Aquacel dressing was placed covering all 3 incisions.  Patient was awakened from anesthesia and transferred to PACU in stable condition.  She tolerated procedure well.  There were no complications.  POST OPERATIVE INSTRUCTIONS: Mobility: No restrictions, may use crutches to ambulate if needed Pain control: Continue to wean/titrate to appropriate oral regimen DVT Prophylaxis: 81 mg aspirin according to protocol Further surgical plans: None LLE: Weightbearing as tolerated, no restrictions Disposition: Home, follow up in clinic within 2 weeks Dressing care:  Keep Aquacel dressing on for 4 to 5 days.  Try to keep the dressing dry and intact.  If it gets slightly wet in the shower simply pat dry.  After the dressing is removed, there is no need to cover the wounds.  Wounds were sealed with Dermabond. Follow-up: Please call [facility] [phone]) to schedule follow-op appointment for 2 weeks after surgery.  TOURNIQUET TIME: N/A  BLOOD LOSS: 50 mL         DRAINS: none         SPECIMEN: none       COMPLICATIONS:  * No complications entered in OR log *         DISPOSITION: PACU - hemodynamically stable.         CONDITION: stable  Ernestina Columbia M.D. Orthopaedic Surgery [facility]   Portions of the record have been created with voice recognition software.  Grammatical and punctuation errors, random word insertions, wrong-word or "sound-a-like" substitutions, pronoun  errors (inaccuracies and/or substitutions), and/or incomplete sentences may have occurred due to the inherent limitations of voice recognition software.  Not all errors are caught or corrected.  Although every attempt is made to root out erroneous and incomplete transcription, the note may still not fully represent the intent or opinion of the author.  Read the chart carefully and recognize, using context, where errors/substitutions have occurred.  Any questions or concerns about the content of this note or information contained within the body of this dictation should be addressed directly with the author for clarification.

## 2021-05-26 NOTE — H&P (Signed)
PREOPERATIVE H&P  HPI: Kim Sparks is a 46 y.o. female who has presented today for surgery, with the diagnosis of left intertrochanteric stress fracture.  The various methods of treatment have been discussed with the patient and family.  After consideration of risks, benefits, and other options for treatment, the patient has consented to LEFT CEPHALO MEDULLARY NAIL PLACEMENT as a surgical intervention.  The patient's history has been reviewed, patient examined, no change in status, stable for surgery.  I have reviewed the patient's chart and labs.  Questions were answered to the patient's satisfaction.    PMH: History reviewed. No pertinent past medical history.  Home Medications Allergies  No current facility-administered medications on file prior to encounter.   Current Outpatient Medications on File Prior to Encounter  Medication Sig Dispense Refill   Biotin 1000 MCG tablet Take 1,000 mcg by mouth daily.     Calcium Carb-Cholecalciferol (CALCIUM 600/VITAMIN D PO) Take 1 tablet by mouth in the morning and at bedtime.     LESSINA-28 0.1-20 MG-MCG tablet Take 1 tablet by mouth daily.     Multiple Vitamins-Minerals (MULTIVITAMIN WITH MINERALS) tablet Take 1 tablet by mouth daily.     VYVANSE 50 MG capsule Take 50 mg by mouth every morning.     Allergies  Allergen Reactions   Penicillins Other (See Comments)    Childhood allergy reaction possible rash     PSH: Past Surgical History:  Procedure Laterality Date   MOUTH SURGERY       Family History Social History  History reviewed. No pertinent family history.  Social History   Socioeconomic History   Marital status: Legally Separated    Spouse name: Not on file   Number of children: Not on file   Years of education: Not on file   Highest education level: Not on file  Occupational History   Not on file  Tobacco Use   Smoking status: Never    Passive exposure: Never   Smokeless tobacco: Never  Vaping Use   Vaping Use: Never  used  Substance and Sexual Activity   Alcohol use: Yes    Comment: occ   Drug use: No   Sexual activity: Yes    Birth control/protection: Pill  Other Topics Concern   Not on file  Social History Narrative   Not on file   Social Determinants of Health   Financial Resource Strain: Not on file  Food Insecurity: Not on file  Transportation Needs: Not on file  Physical Activity: Not on file  Stress: Not on file  Social Connections: Not on file     Review of Systems: MSK: As noted per HPI above GI: No current Nausea/vomiting ENT: Denies sore throat, epistaxis CV: Denies chest pain Resp: No current shortness of breath  Other than mentioned above, there are no Constitutional, Neurological, Psychiatric, ENT, Ophthalmological, Cardiovascular, Respiratory, GI, GU, Musculoskeletal, Integumentary, Lymphatic, Endocrine or Allergic issues.   Physical Examination: CV: Normal distal pulses Lungs: Unlabored respirations LLE: Minimal groin pain with logroll.  Pain in trochanteric/intertrochanteric area.  Able to straight leg raise against resistance with negative Stinchfield test.  Tender to palpation deeply about the trochanteric area of the femur.  Intact dorsiflexion, plantarflexion, and EHL against resistance.  Sensation intact to light touch in the superficial peroneal, deep peroneal, and tibial distributions.  Normal distal pulses.  Warm well-perfused distally.  Assessment/Plan: LEFT CEPHALO MEDULLARY NAIL PLACEMENT    Ernestina Columbia M.D. Orthopaedic Surgery [facility]  Review  of this patient's medications prescribed by other providers does not in any way constitute an endorsement by this clinician of their use, indications, dosage, route, efficacy, interactions, or other clinical parameters.  Portions of the record have been created with voice recognition software.  Grammatical and punctuation errors, random word insertions, wrong-word or "sound-a-like"  substitutions, pronoun errors (inaccuracies and/or substitutions), and/or incomplete sentences may have occurred due to the inherent limitations of voice recognition software.  Not all errors are caught or corrected.  Although every attempt is made to root out erroneous and incomplete transcription, the note may still not fully represent the intent or opinion of the author.  Read the chart carefully and recognize, using context, where errors/substitutions have occurred.  Any questions or concerns about the content of this note or information contained within the body of this dictation should be addressed directly with the author for clarification.

## 2021-05-26 NOTE — Progress Notes (Signed)
Pt notified to arrive at [facility] admitting by 0930 to 0945 for surgical time change. Pt aware to stop clear liquids and have pre surgery drink consumed by 0830.

## 2021-05-26 NOTE — Progress Notes (Signed)
Patient notified again that her surgeon is still in surgery with another patient.  The doctor is coming to an end with that case but told patient I do not have an estimate on when her surgery will start, informed patient the OR will still need to be cleaned after that case.  Apologized to patient again.  Thanked patient for being understanding.  As I was leaving the room, Dr. Sherilyn Dacosta arrived to speak to patient.

## 2021-05-26 NOTE — Progress Notes (Signed)
OR informed short stay to have patient arrive 1 hour early, procedure time still showing 1230, but according to OR, time is being moved up by 1 hour, therefore started the Vancomycin at 1030 with anticipation of a 1130 procedure start time.

## 2021-05-26 NOTE — Progress Notes (Signed)
Patient informed that her surgeon has been delayed.  Unable to give patient a definite start time but informed her he is still working on the case before her and then she is next.  Patient voiced understanding.  Apologized for the inconvenience.

## 2021-05-26 NOTE — Transfer of Care (Signed)
Immediate Anesthesia Transfer of Care Note  Patient: Kim Sparks  Procedure(s) Performed: LEFT CEPHALO MEDULLARY NAIL PLACEMENT (Left: Hip)  Patient Location: PACU  Anesthesia Type:General  Level of Consciousness: drowsy and patient cooperative  Airway & Oxygen Therapy: Patient Spontanous Breathing and Patient connected to face mask oxygen  Post-op Assessment: Report given to RN and Post -op Vital signs reviewed and stable  Post vital signs: Reviewed and stable  Last Vitals:  Vitals Value Taken Time  BP 137/78 05/26/21 1639  Temp    Pulse 53 05/26/21 1640  Resp 25 05/26/21 1640  SpO2 100 % 05/26/21 1640  Vitals shown include unvalidated device data.  Last Pain:  Vitals:   05/26/21 1020  TempSrc:   PainSc: 3       Patients Stated Pain Goal: 2 ([id number])  Complications: No notable events documented.

## 2021-05-26 NOTE — Progress Notes (Deleted)
Patient informed the surgeon is behind schedule and she will not be going to surgery at 1600, possible 1.5 hours behind schedule.  Patient voiced understanding.  °  °  °  ° °

## 2021-05-26 NOTE — Anesthesia Preprocedure Evaluation (Signed)
Anesthesia Evaluation  Patient identified by MRN, date of birth, ID band Patient awake    Reviewed: Allergy & Precautions, NPO status , Patient's Chart, lab work & pertinent test results  Airway Mallampati: II  TM Distance: >3 FB     Dental   Pulmonary neg pulmonary ROS,    breath sounds clear to auscultation       Cardiovascular negative cardio ROS   Rhythm:Regular Rate:Normal     Neuro/Psych negative neurological ROS     GI/Hepatic negative GI ROS, Neg liver ROS,   Endo/Other  negative endocrine ROS  Renal/GU negative Renal ROS     Musculoskeletal   Abdominal   Peds  Hematology negative hematology ROS (+)   Anesthesia Other Findings   Reproductive/Obstetrics                             Anesthesia Physical Anesthesia Plan  ASA: 3  Anesthesia Plan: General   Post-op Pain Management:    Induction: Intravenous  PONV Risk Score and Plan: 3 and Ondansetron, Dexamethasone and Midazolam  Airway Management Planned: Oral ETT  Additional Equipment:   Intra-op Plan:   Post-operative Plan:   Informed Consent: I have reviewed the patients History and Physical, chart, labs and discussed the procedure including the risks, benefits and alternatives for the proposed anesthesia with the patient or authorized representative who has indicated his/her understanding and acceptance.     Dental advisory given  Plan Discussed with: CRNA and Anesthesiologist  Anesthesia Plan Comments:         Anesthesia Quick Evaluation

## 2021-05-26 NOTE — Anesthesia Procedure Notes (Signed)
Procedure Name: Intubation Date/Time: 05/26/2021 2:23 PM Performed by: Montel Clock, CRNA Pre-anesthesia Checklist: Patient identified, Emergency Drugs available, Suction available, Patient being monitored and Timeout performed Patient Re-evaluated:Patient Re-evaluated prior to induction Oxygen Delivery Method: Circle system utilized Preoxygenation: Pre-oxygenation with 100% oxygen Induction Type: IV induction Ventilation: Mask ventilation without difficulty Laryngoscope Size: Mac and 3 Grade View: Grade I Tube type: Oral Tube size: 7.0 mm Number of attempts: 1 Airway Equipment and Method: Stylet Placement Confirmation: ETT inserted through vocal cords under direct vision, positive ETCO2 and breath sounds checked- equal and bilateral Secured at: 21 cm Tube secured with: Tape Dental Injury: Teeth and Oropharynx as per pre-operative assessment

## 2021-05-27 ENCOUNTER — Encounter (HOSPITAL_COMMUNITY): Payer: Self-pay | Admitting: Orthopedic Surgery

## 2021-05-27 NOTE — Anesthesia Postprocedure Evaluation (Signed)
Anesthesia Post Note  Patient: Kim Sparks  Procedure(s) Performed: LEFT CEPHALO MEDULLARY NAIL PLACEMENT (Left: Hip)     Patient location during evaluation: PACU Anesthesia Type: General Level of consciousness: awake Pain management: pain level controlled Vital Signs Assessment: post-procedure vital signs reviewed and stable Respiratory status: spontaneous breathing Cardiovascular status: stable Postop Assessment: no apparent nausea or vomiting Anesthetic complications: no   No notable events documented.  Last Vitals:  Vitals:   05/26/21 1715 05/26/21 1730  BP: 124/86 126/84  Pulse: (!) 48 (!) 50  Resp: 10 13  Temp:  36.4 C  SpO2: 99% 95%    Last Pain:  Vitals:   05/26/21 1730  TempSrc:   PainSc: 4                  [name]

## 2021-06-25 NOTE — Patient Outreach (Signed)
[facility]) Care Management ? ?06/25/2021 ? ?EIKO CRETELLA ?Feb 25, 1976 ?[id number] ? ? ?Received referral for of screening/assessments for Complex needs from Insurance plan. Assigned patient to [employer], LCSW for follow up. ? ?Philmore Pali ?[place] Management Assistant ?[phone] ? ?

## 2021-07-03 ENCOUNTER — Other Ambulatory Visit: Payer: Self-pay | Admitting: *Deleted

## 2021-07-06 ENCOUNTER — Encounter: Payer: Self-pay | Admitting: *Deleted

## 2021-07-06 NOTE — Patient Outreach (Signed)
Care Management ?Clinical Social Work Note ? ?07/06/2021 ?Name: Kim Sparks MRN: [id number] DOB: [birth year] ? ?Kim Sparks is a 46 y.o. year old female who is a primary care patient of [place].  The Care Management team was consulted for assistance with chronic disease management and coordination needs. ? ?Engaged with patient by telephone for initial outreach call in response to provider referral for social work chronic care management and care coordination services.  Patient denies the need for social work involvement at this time.  Patient did not wish to receive resources, or a referral for ETOH use/abuse, or mental health counseling and supportive services for psychoses. ? ?Consent to Services:  ?Kim Sparks was given information about Care Management services today including:  ?Care Management services includes personalized support from designated clinical staff supervised by her physician, including individualized plan of care and coordination with other care providers ?24/7 contact phone numbers for assistance for urgent and routine care needs. ?The patient may stop case management services at any time by phone call to the office staff. ? ?Patient agreed to services and consent obtained.  ? ?Assessment: Review of patient past medical history, allergies, medications, and health status, including review of relevant consultants reports was performed today as part of a comprehensive evaluation and provision of chronic care management and care coordination services. ? ?SDOH (Social Determinants of Health) assessments and interventions performed:  ?SDOH Interventions   ? ?Flowsheet Row Most Recent Value  ?SDOH Interventions   ?Food Insecurity Interventions Intervention Not Indicated  ?Financial Strain Interventions Intervention Not Indicated  ?Housing Interventions Intervention Not Indicated  ?Intimate Partner Violence Interventions Intervention Not Indicated  ?Physical Activity  Interventions Intervention Not Indicated  ?Stress Interventions Intervention Not Indicated  ?Social Connections Interventions Intervention Not Indicated  ?Transportation Interventions Intervention Not Indicated  ? ?  ?  ? ?Advanced Directives Status: Not ready or willing to discuss. ? ?Care Plan ? ?Allergies  ?Allergen Reactions  ? Penicillins Other (See Comments)  ?  Childhood allergy reaction possible rash  ? ? ?Outpatient Encounter Medications as of 07/03/2021  ?Medication Sig  ? Biotin 1000 MCG tablet Take 1,000 mcg by mouth daily.  ? Calcium Carb-Cholecalciferol (CALCIUM 600/VITAMIN D PO) Take 1 tablet by mouth in the morning and at bedtime.  ? LESSINA-28 0.1-20 MG-MCG tablet Take 1 tablet by mouth daily.  ? Multiple Vitamins-Minerals (MULTIVITAMIN WITH MINERALS) tablet Take 1 tablet by mouth daily.  ? oxyCODONE (ROXICODONE) 5 MG immediate release tablet Take 1 tablet (5 mg total) by mouth every 6 (six) hours as needed for severe pain.  ? promethazine (PHENERGAN) 12.5 MG tablet Take 1 tablet (12.5 mg total) by mouth every 6 (six) hours as needed for nausea or vomiting.  ? VYVANSE 50 MG capsule Take 50 mg by mouth every morning.  ? ?No facility-administered encounter medications on file as of 07/03/2021.  ? ? 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  Mental Health Concerns, Substance Abuse Issues, and Lacks Knowledge of [facility]. ? ?There are no care plans that you recently modified to display for this patient. ?  ? ?No Follow-Up Required. ? ?Danford Bad, BSW, MSW, LCSW  ?Licensed Clinical Social Worker  ?[facility] Customer service manager Care Management ?[facility]  ?Mailing Address-[address], [place], [place] [address] ?Physical Address-[address] [place], [place], [place] [address] ?Toll Free Main # [phone] ?Fax # [phone] ?Cell # [phone] ?[email or web address] ? ? ? ? ? ?  ? ? ?

## 2021-11-30 ENCOUNTER — Emergency Department (HOSPITAL_BASED_OUTPATIENT_CLINIC_OR_DEPARTMENT_OTHER)
Admission: EM | Admit: 2021-11-30 | Discharge: 2021-11-30 | Disposition: A | Discharge status: Home / Self Care | Payer: BC Managed Care – PPO | Attending: Emergency Medicine | Admitting: Emergency Medicine

## 2021-11-30 ENCOUNTER — Emergency Department (HOSPITAL_BASED_OUTPATIENT_CLINIC_OR_DEPARTMENT_OTHER): Payer: BC Managed Care – PPO

## 2021-11-30 ENCOUNTER — Encounter (HOSPITAL_BASED_OUTPATIENT_CLINIC_OR_DEPARTMENT_OTHER): Payer: Self-pay

## 2021-11-30 ENCOUNTER — Other Ambulatory Visit: Payer: Self-pay

## 2021-11-30 DIAGNOSIS — K604 Rectal fistula: Secondary | ICD-10-CM | POA: Diagnosis not present

## 2021-11-30 DIAGNOSIS — K6289 Other specified diseases of anus and rectum: Secondary | ICD-10-CM | POA: Diagnosis present

## 2021-11-30 DIAGNOSIS — K649 Unspecified hemorrhoids: Secondary | ICD-10-CM

## 2021-11-30 DIAGNOSIS — K602 Anal fissure, unspecified: Secondary | ICD-10-CM

## 2021-11-30 LAB — COMPREHENSIVE METABOLIC PANEL
ALT: 24 U/L (ref 0–44)
AST: 20 U/L (ref 15–41)
Albumin: 4.3 g/dL (ref 3.5–5.0)
Alkaline Phosphatase: 42 U/L (ref 38–126)
Anion gap: 5 (ref 5–15)
BUN: 5 mg/dL — ABNORMAL LOW (ref 6–20)
CO2: 29 mmol/L (ref 22–32)
Calcium: 9.3 mg/dL (ref 8.9–10.3)
Chloride: 106 mmol/L (ref 98–111)
Creatinine, Ser: 0.59 mg/dL (ref 0.44–1.00)
GFR, Estimated: >60 mL/min (ref >60)
Glucose, Bld: 92 mg/dL (ref 70–99)
Potassium: 3.7 mmol/L (ref 3.5–5.1)
Sodium: 140 mmol/L (ref 135–145)
Total Bilirubin: 0.4 mg/dL (ref 0.3–1.2)
Total Protein: 7.4 g/dL (ref 6.5–8.1)

## 2021-11-30 LAB — CBC WITH DIFFERENTIAL/PLATELET
Abs Immature Granulocytes: 0.01 10*3/uL (ref 0.00–0.07)
Basophils Absolute: 0 10*3/uL (ref 0.0–0.1)
Basophils Relative: 1 %
Eosinophils Absolute: 0.1 10*3/uL (ref 0.0–0.5)
Eosinophils Relative: 1 %
HCT: 40.5 % (ref 36.0–46.0)
Hemoglobin: 13.4 g/dL (ref 12.0–15.0)
Immature Granulocytes: 0 %
Lymphocytes Relative: 27 %
Lymphs Abs: 1.2 10*3/uL (ref 0.7–4.0)
MCH: 31.1 pg (ref 26.0–34.0)
MCHC: 33.1 g/dL (ref 30.0–36.0)
MCV: 94 fL (ref 80.0–100.0)
Monocytes Absolute: 0.6 10*3/uL (ref 0.1–1.0)
Monocytes Relative: 13 %
Neutro Abs: 2.6 10*3/uL (ref 1.7–7.7)
Neutrophils Relative %: 58 %
Platelets: 189 10*3/uL (ref 150–400)
RBC: 4.31 MIL/uL (ref 3.87–5.11)
RDW: 12.4 % (ref 11.5–15.5)
WBC: 4.5 10*3/uL (ref 4.0–10.5)
nRBC: 0 % (ref 0.0–0.2)

## 2021-11-30 LAB — HCG, SERUM, QUALITATIVE: Preg, Serum: NEGATIVE

## 2021-11-30 MED ORDER — IOHEXOL 300 MG/ML  SOLN
80.0000 mL | Freq: Once | INTRAMUSCULAR | Status: AC | PRN
  Administered 2021-11-30: 80 mL via INTRAVENOUS

## 2021-11-30 MED ORDER — HYDROCORTISONE (PERIANAL) 2.5 % EX CREA: 1.0000 | TOPICAL_CREAM | Freq: Two times a day (BID) | CUTANEOUS | 0 refills | Status: AC

## 2021-11-30 MED ORDER — LIDOCAINE 5 % EX OINT: 1.0000 | TOPICAL_OINTMENT | CUTANEOUS | 0 refills | Status: AC | PRN

## 2021-11-30 NOTE — ED Provider Notes (Signed)
[facility] EMERGENCY DEPT Provider Note   CSN: [id number] Arrival date & time: 11/30/21  1253     History  Chief Complaint  Patient presents with   Rectal Pain    Kim Sparks is a 46 y.o. female here for evaluation of rectal pain.  Began on Friday.  Pain worse with bowel movements and passing flatus.  No melena bright blood per rectum.  No prior history of hemorrhoids.  Did note symptoms began after intercourse with significant other, rectal involvement. No fever, nausea, vomiting, swelling, abdominal pain.  HPI     Home Medications Prior to Admission medications   Medication Sig Start Date End Date Taking? Authorizing Provider  hydrocortisone (ANUSOL-HC) 2.5 % rectal cream Place 1 Application rectally 2 (two) times daily. 11/30/21  Yes Elizet Kaplan A, PA-C  lidocaine (XYLOCAINE) 5 % ointment Apply 1 Application topically as needed. 11/30/21  Yes Morrison Mcbryar A, PA-C  Biotin 1000 MCG tablet Take 1,000 mcg by mouth daily.    [provider]  Calcium Carb-Cholecalciferol (CALCIUM 600/VITAMIN D PO) Take 1 tablet by mouth in the morning and at bedtime.    [provider]  LESSINA-28 0.1-20 MG-MCG tablet Take 1 tablet by mouth daily. 05/19/21   [provider]  Multiple Vitamins-Minerals (MULTIVITAMIN WITH MINERALS) tablet Take 1 tablet by mouth daily.    [provider]  oxyCODONE (ROXICODONE) 5 MG immediate release tablet Take 1 tablet (5 mg total) by mouth every 6 (six) hours as needed for severe pain. 05/26/21   Georgeanna Harrison, MD  promethazine (PHENERGAN) 12.5 MG tablet Take 1 tablet (12.5 mg total) by mouth every 6 (six) hours as needed for nausea or vomiting. 05/26/21   Georgeanna Harrison, MD  VYVANSE 50 MG capsule Take 50 mg by mouth every morning. 04/21/21   [provider]      Allergies    Penicillins    Review of Systems   Review of Systems  Constitutional: Negative.   HENT: Negative.    Respiratory:  Negative.    Cardiovascular: Negative.   Gastrointestinal:  Positive for rectal pain. Negative for abdominal distention, abdominal pain, anal bleeding, blood in stool, constipation, diarrhea, nausea and vomiting.  Genitourinary: Negative.   Musculoskeletal: Negative.   Skin: Negative.   Neurological: Negative.   All other systems reviewed and are negative.   Physical Exam Updated Vital Signs BP (!) 146/87   Pulse 68   Temp 97.9 F (36.6 C) (Oral)   Resp 20   Ht 5\' 3"  (1.6 m)   Wt 103.4 kg   LMP 11/13/2021 (Exact Date) Comment: neg preg test  SpO2 100%   BMI 40.39 kg/m  Physical Exam Vitals and nursing note reviewed. Exam conducted with a chaperone present.  Constitutional:      General: She is not in acute distress.    Appearance: She is well-developed. She is not ill-appearing, toxic-appearing or diaphoretic.  HENT:     Head: Normocephalic and atraumatic.  Eyes:     Pupils: Pupils are equal, round, and reactive to light.  Cardiovascular:     Rate and Rhythm: Normal rate.  Pulmonary:     Effort: No respiratory distress.  Abdominal:     General: There is no distension.  Genitourinary:    Comments: Female tech in room as chaperone.  No rectal/perirectal fluctuance, induration or erythema.  She is small anal fissure and hemorrhoid at the 6 o'clock position.  Did not tolerate internal rectal exam very well due to  pain.  Light brown stool in rectal vault, no gross blood Musculoskeletal:        General: Normal range of motion.     Cervical back: Normal range of motion.  Skin:    General: Skin is warm and dry.  Neurological:     General: No focal deficit present.     Mental Status: She is alert.  Psychiatric:        Mood and Affect: Mood normal.     ED Results / Procedures / Treatments   Labs (all labs ordered are listed, but only abnormal results are displayed) Labs Reviewed  COMPREHENSIVE METABOLIC PANEL - Abnormal; Notable for the following components:       Result Value   BUN 5 (*)    All other components within normal limits  CBC WITH DIFFERENTIAL/PLATELET  HCG, SERUM, QUALITATIVE    EKG None  Radiology CT ABDOMEN PELVIS W CONTRAST  Result Date: 11/30/2021 CLINICAL DATA:  Rectal pain. EXAM: CT ABDOMEN AND PELVIS WITH CONTRAST TECHNIQUE: Multidetector CT imaging of the abdomen and pelvis was performed using the standard protocol following bolus administration of intravenous contrast. RADIATION DOSE REDUCTION: This exam was performed according to the departmental dose-optimization program which includes automated exposure control, adjustment of the mA and/or kV according to patient size and/or use of iterative reconstruction technique. CONTRAST:  49mL OMNIPAQUE IOHEXOL 300 MG/ML  SOLN COMPARISON:  None Available. FINDINGS: Lower chest: The visualized lung bases are clear. No intra-abdominal free air.  Trace free fluid within the pelvis. Hepatobiliary: No focal liver abnormality is seen. No gallstones, gallbladder wall thickening, or biliary dilatation. Pancreas: Unremarkable. No pancreatic ductal dilatation or surrounding inflammatory changes. Spleen: Normal in size without focal abnormality. Adrenals/Urinary Tract: Adrenal glands are unremarkable. Kidneys are normal, without renal calculi, focal lesion, or hydronephrosis. Bladder is unremarkable. Stomach/Bowel: There is no bowel obstruction or active inflammation. The appendix is normal. Vascular/Lymphatic: The abdominal aorta and IVC are unremarkable. No portal venous gas. There is no adenopathy. Reproductive: The uterus is anteverted and grossly unremarkable. The right ovary is unremarkable. Two adjacent cysts or a septated cyst, with possible rupture, measuring up to 3.5 cm in the left ovary. No follow-up imaging recommended. Note: This recommendation does not apply to premenarchal patients and to those with increased risk (genetic, family history, elevated tumor markers or other high-risk factors) of  ovarian cancer. Reference: JACR 2020 Feb; 17(2):248-254 Other: None Musculoskeletal: Left femoral intramedullary nail and cervical screws. No acute osseous pathology. IMPRESSION: 1. No bowel obstruction. Normal appendix. 2. Two adjacent cysts or a septated cyst with possible rupture in the left ovary. Electronically Signed   By: Elgie Collard M.D.   On: 11/30/2021 20:14    Procedures Procedures    Medications Ordered in ED Medications  iohexol (OMNIPAQUE) 300 MG/ML solution 80 mL (80 mLs Intravenous Contrast Given 11/30/21 1956)    ED Course/ Medical Decision Making/ A&P    46 year old here for evaluation of rectal pain for the last 4 days after interaction with significant other.  She is having bowel movements and not melena or bright red blood per rectum.  No abdominal pain, fever.  On exam she does not have any evidence of perirectal abscess.  Does have very small anal fissure and nonbleeding hemorrhoid.  Did not tolerate internal exam very well due to pain.  Light brown stool in rectal vault with any gross blood.  We will plan on CT imaging for further evaluation  Labs and imaging personally viewed and  interpreted:  Preg neg CBC without leukocytosis CMP without significant abnormality CT AP with ovarian cyst  Discussed exam, labs and imaging with patient.  Pain likely due to anal fissure and hemorrhoid.  We will treat as such.  Discussed sitz bath as well as prescription medications, close follow-up with PCP.  She will return for new or worsening symptoms.  The patient has been appropriately medically screened and/or stabilized in the ED. I have low suspicion for any other emergent medical condition which would require further screening, evaluation or treatment in the ED or require inpatient management.  Patient is hemodynamically stable and in no acute distress.  Patient able to ambulate in department prior to ED.  Evaluation does not show acute pathology that would require ongoing  or additional emergent interventions while in the emergency department or further inpatient treatment.  I have discussed the diagnosis with the patient and answered all questions.  Pain is been managed while in the emergency department and patient has no further complaints prior to discharge.  Patient is comfortable with plan discussed in room and is stable for discharge at this time.  I have discussed strict return precautions for returning to the emergency department.  Patient was encouraged to follow-up with PCP/specialist refer to at discharge.                           Medical Decision Making Amount and/or Complexity of Data Reviewed External Data Reviewed: labs, radiology and notes. Labs: ordered. Decision-making details documented in ED Course. Radiology: ordered and independent interpretation performed. Decision-making details documented in ED Course.  Risk Prescription drug management.           Final Clinical Impression(s) / ED Diagnoses Final diagnoses:  Rectal fissure  Hemorrhoids, unspecified hemorrhoid type    Rx / DC Orders ED Discharge Orders          Ordered    hydrocortisone (ANUSOL-HC) 2.5 % rectal cream  2 times daily        11/30/21 2028    lidocaine (XYLOCAINE) 5 % ointment  As needed        11/30/21 2028              Simona Rocque A, PA-C 11/30/21 2034    Lonell Grandchild, MD 11/30/21 2321

## 2021-11-30 NOTE — ED Triage Notes (Signed)
Onset Friday of rectal pain getting worse.  Pain with bowel movement and gas.  Denies any bleeding

## 2021-11-30 NOTE — ED Notes (Signed)
Discharge paperwork given and verbally understood. 

## 2021-11-30 NOTE — Discharge Instructions (Signed)
Please use sitz bath 2-3 times daily as well as the Anusol cream in the topical lidocaine.  May also start using MiraLAX to help soften bowel movements.  Follow-up with primary care provider if symptoms have not improved within the week return for any worsening symptoms

## 2022-06-15 ENCOUNTER — Ambulatory Visit
Admission: RE | Admit: 2022-06-15 | Discharge: 2022-06-15 | Disposition: A | Discharge status: Home / Self Care | Payer: BC Managed Care – PPO | Source: Ambulatory Visit | Attending: Nurse Practitioner | Admitting: Nurse Practitioner

## 2022-06-15 VITALS — BP 116/79 | HR 67 | Temp 98.0°F | Resp 18

## 2022-06-15 DIAGNOSIS — R350 Frequency of micturition: Secondary | ICD-10-CM

## 2022-06-15 LAB — POCT URINALYSIS DIP (MANUAL ENTRY)
Bilirubin, UA: NEGATIVE
Blood, UA: NEGATIVE
Glucose, UA: NEGATIVE mg/dL
Ketones, POC UA: NEGATIVE mg/dL
Leukocytes, UA: NEGATIVE
Nitrite, UA: NEGATIVE
Protein Ur, POC: NEGATIVE mg/dL
Spec Grav, UA: 1.025 (ref 1.010–1.025)
Urobilinogen, UA: 0.2 E.U./dL
pH, UA: 5.5 (ref 5.0–8.0)

## 2022-06-15 NOTE — ED Triage Notes (Signed)
Pt reports she has some lower abdominal pain, low abdominal pain, burning when urinating, frequent urination x 2 weeks. Pt seen a [employer] [facility] and they gave her Macrobid that she finished on 2/26. Pt states the UTI feels like it is back.   Pt lifts weights so she doesn't know if her back pain is due to that or the uti.

## 2022-06-15 NOTE — ED Provider Notes (Signed)
[facility]    CSN: [id number] Arrival date & time: 06/15/22  1329      History   Chief Complaint Chief Complaint  Patient presents with   Urinary Tract Infection    HPI Kim Sparks is a 47 y.o. female.   Patient presents today for 2-day history of burning with urination, increased urinary frequency and urgency, voiding smaller amounts, and right upper back pain.  She denies new urinary incontinence, foul urinary odor, abdominal pain, suprapubic pain/pressure, fever, nausea/vomiting, and flank pain.  Reports she had blood in her urine yesterday, however this has resolved.  Reports she was sexually active prior to symptoms starting, not concerned about STI today.  No vaginal discharge.  She wonders if the back pain may be coming from lifting weights-reports she recently started lifting weights.  Patient reports initially, symptoms started 06/04/2022 and were much more severe at that time. She did a virtual visit and was treated with Macrobid for symptoms.  Reports her symptoms improved, however never fully went away.    History reviewed. No pertinent past medical history.  There are no problems to display for this patient.   Past Surgical History:  Procedure Laterality Date   FEMUR IM NAIL Left 05/26/2021   Procedure: LEFT CEPHALO MEDULLARY NAIL PLACEMENT;  Surgeon: Georgeanna Harrison, MD;  Location: [facility];  Service: Orthopedics;  Laterality: Left;   MOUTH SURGERY      OB History     Gravida      Para      Term      Preterm      AB      Living  2      SAB      IAB      Ectopic      Multiple      Live Births               Home Medications    Prior to Admission medications   Medication Sig Start Date End Date Taking? Authorizing Provider  Biotin 1000 MCG tablet Take 1,000 mcg by mouth daily.    [provider]  Calcium Carb-Cholecalciferol (CALCIUM 600/VITAMIN D PO) Take 1 tablet by mouth in the morning and at bedtime.     [provider]  hydrocortisone (ANUSOL-HC) 2.5 % rectal cream Place 1 Application rectally 2 (two) times daily. 11/30/21   Henderly, Britni A, PA-C  LESSINA-28 0.1-20 MG-MCG tablet Take 1 tablet by mouth daily. 05/19/21   [provider]  lidocaine (XYLOCAINE) 5 % ointment Apply 1 Application topically as needed. 11/30/21   Henderly, Britni A, PA-C  Multiple Vitamins-Minerals (MULTIVITAMIN WITH MINERALS) tablet Take 1 tablet by mouth daily.    [provider]  oxyCODONE (ROXICODONE) 5 MG immediate release tablet Take 1 tablet (5 mg total) by mouth every 6 (six) hours as needed for severe pain. 05/26/21   Georgeanna Harrison, MD  promethazine (PHENERGAN) 12.5 MG tablet Take 1 tablet (12.5 mg total) by mouth every 6 (six) hours as needed for nausea or vomiting. 05/26/21   Georgeanna Harrison, MD  VYVANSE 50 MG capsule Take 50 mg by mouth every morning. 04/21/21   [provider]    Family History History reviewed. No pertinent family history.  Social History Social History   Tobacco Use   Smoking status: Never    Passive exposure: Never   Smokeless tobacco: Never  Vaping Use   Vaping Use: Never used  Substance Use Topics   Alcohol  use: Not Currently    Comment: occ   Drug use: No     Allergies   Penicillins and Lamotrigine   Review of Systems Review of Systems Per HPI  Physical Exam Triage Vital Signs ED Triage Vitals  Enc Vitals Group     BP 06/15/22 1341 116/79     Pulse Rate 06/15/22 1341 67     Resp 06/15/22 1341 18     Temp 06/15/22 1341 98 F (36.7 C)     Temp Source 06/15/22 1341 Oral     SpO2 06/15/22 1341 98 %     Weight --      Height --      Head Circumference --      Peak Flow --      Pain Score 06/15/22 1339 6     Pain Loc --      Pain Edu? --      Excl. in [place]? --    No data found.  Updated Vital Signs BP 116/79 (BP Location: Right Arm)   Pulse 67   Temp 98 F (36.7 C) (Oral)   Resp 18   LMP 05/31/2022   SpO2 98%    Visual Acuity Right Eye Distance:   Left Eye Distance:   Bilateral Distance:    Right Eye Near:   Left Eye Near:    Bilateral Near:     Physical Exam Vitals and nursing note reviewed.  Constitutional:      General: She is not in acute distress.    Appearance: She is not toxic-appearing.  Abdominal:     General: Abdomen is flat. Bowel sounds are normal. There is no distension.     Palpations: Abdomen is soft. There is no mass.     Tenderness: There is no abdominal tenderness. There is no right CVA tenderness, left CVA tenderness or guarding.  Musculoskeletal:       Arms:     Comments: Tender to palpation in area marked; no obvious deformity, redness, bruising, or swelling.  Full range of motion to right upper extremity.  No CVA tenderness.  Skin:    General: Skin is warm and dry.     Coloration: Skin is not jaundiced or pale.     Findings: No erythema.  Neurological:     Mental Status: She is alert and oriented to person, place, and time.     Motor: No weakness.     Gait: Gait normal.  Psychiatric:        Behavior: Behavior is cooperative.      UC Treatments / Results  Labs (all labs ordered are listed, but only abnormal results are displayed) Labs Reviewed  POCT URINALYSIS DIP (MANUAL ENTRY)    EKG   Radiology No results found.  Procedures Procedures (including critical care time)  Medications Ordered in UC Medications - No data to display  Initial Impression / Assessment and Plan / UC Course  I have reviewed the triage vital signs and the nursing notes.  Pertinent labs & imaging results that were available during my care of the patient were reviewed by me and considered in my medical decision making (see chart for details).   Patient is well-appearing, normotensive, afebrile, not tachycardic, not tachypneic, oxygenating well on room air.    1. Urinary frequency Urinalysis today unremarkable; therefore urine culture deferred at this time Vital signs  and examination today are reassuring Suspect possible interstitial cystitis Can try Azo/Pyridium over-the-counter Recommended avoidance of bladder irritants, follow-up with primary  care provider or urologist if symptoms persist or worsen despite treatment  The patient was given the opportunity to ask questions.  All questions answered to their satisfaction.  The patient is in agreement to this plan.    Final Clinical Impressions(s) / UC Diagnoses   Final diagnoses:  Urinary frequency     Discharge Instructions      The urine sample today does not show any signs of a urinary tract infection.  Please try to increase water intake, avoid caffeine, acidic foods, spicy foods, chocolate, tomatoes, other things that can be bladder irritants.  You can try to take Azo to see if this helps with bladder irritation.  If symptoms persist despite treatment, recommend following up with primary care provider or a urologist.     ED Prescriptions   None    PDMP not reviewed this encounter.   Eulogio Bear, NP 06/15/22 1420

## 2022-06-15 NOTE — Discharge Instructions (Signed)
The urine sample today does not show any signs of a urinary tract infection.  Please try to increase water intake, avoid caffeine, acidic foods, spicy foods, chocolate, tomatoes, other things that can be bladder irritants.  You can try to take Azo to see if this helps with bladder irritation.  If symptoms persist despite treatment, recommend following up with primary care provider or a urologist.

## 2023-10-07 ENCOUNTER — Encounter (HOSPITAL_BASED_OUTPATIENT_CLINIC_OR_DEPARTMENT_OTHER): Payer: Self-pay

## 2023-10-07 ENCOUNTER — Other Ambulatory Visit: Payer: Self-pay

## 2023-10-07 DIAGNOSIS — X58XXXA Exposure to other specified factors, initial encounter: Secondary | ICD-10-CM | POA: Diagnosis not present

## 2023-10-07 DIAGNOSIS — S0991XA Unspecified injury of ear, initial encounter: Secondary | ICD-10-CM | POA: Diagnosis present

## 2023-10-07 DIAGNOSIS — S01312A Laceration without foreign body of left ear, initial encounter: Secondary | ICD-10-CM | POA: Diagnosis not present

## 2023-10-07 NOTE — ED Triage Notes (Signed)
 Pt reports split in L ear this morning when trying to take shirt off. No bleeding in triage, advised by UC to be seen for stitches.

## 2023-10-08 ENCOUNTER — Emergency Department (HOSPITAL_BASED_OUTPATIENT_CLINIC_OR_DEPARTMENT_OTHER)
Admission: EM | Admit: 2023-10-08 | Discharge: 2023-10-08 | Disposition: A | Discharge status: Home / Self Care | Attending: Emergency Medicine | Admitting: Emergency Medicine

## 2023-10-08 DIAGNOSIS — S01312A Laceration without foreign body of left ear, initial encounter: Secondary | ICD-10-CM

## 2023-10-08 MED ORDER — LIDOCAINE HCL (PF) 1 % IJ SOLN
5.0000 mL | Freq: Once | INTRAMUSCULAR | Status: AC
Start: 2023-10-08 — End: 2023-10-08
  Administered 2023-10-08: 5 mL
  Filled 2023-10-08: qty 5

## 2023-10-08 NOTE — ED Provider Notes (Signed)
 [facility] AT [facility] Provider Note   CSN: [id number] Arrival date & time: 10/07/23  1950     Patient presents with: No chief complaint on file.   Kim Sparks is a 48 y.o. female.   Patient reports that she was taking her shirt off earlier today and pulled an earring out of her ear, splitting the earlobe open.       Prior to Admission medications   Medication Sig Start Date End Date Taking? Authorizing Provider  Biotin 1000 MCG tablet Take 1,000 mcg by mouth daily.    [provider]  Calcium Carb-Cholecalciferol (CALCIUM 600/VITAMIN D PO) Take 1 tablet by mouth in the morning and at bedtime.    [provider]  hydrocortisone  (ANUSOL -HC) 2.5 % rectal cream Place 1 Application rectally 2 (two) times daily. 11/30/21   Henderly, Britni A, PA-C  LESSINA-28 0.1-20 MG-MCG tablet Take 1 tablet by mouth daily. 05/19/21   [provider]  lidocaine  (XYLOCAINE ) 5 % ointment Apply 1 Application topically as needed. 11/30/21   Henderly, Britni A, PA-C  Multiple Vitamins-Minerals (MULTIVITAMIN WITH MINERALS) tablet Take 1 tablet by mouth daily.    [provider]  oxyCODONE  (ROXICODONE ) 5 MG immediate release tablet Take 1 tablet (5 mg total) by mouth every 6 (six) hours as needed for severe pain. 05/26/21   Doll Skates, MD  promethazine  (PHENERGAN ) 12.5 MG tablet Take 1 tablet (12.5 mg total) by mouth every 6 (six) hours as needed for nausea or vomiting. 05/26/21   Doll Skates, MD  VYVANSE 50 MG capsule Take 50 mg by mouth every morning. 04/21/21   [provider]    Allergies: Penicillins and Lamotrigine    Review of Systems  Skin:        Laceration    Updated Vital Signs BP 136/87 (BP Location: Right Arm)   Pulse (!) 56   Temp 98 F (36.7 C) (Oral)   Resp 16   SpO2 99%   Physical Exam Vitals and nursing note reviewed.  Constitutional:      Appearance: Normal appearance.  HENT:     Head:     Skin:    Findings: Laceration (Lobule left ear) present.   Neurological:     Mental Status: She is alert.     (all labs ordered are listed, but only abnormal results are displayed) Labs Reviewed - No data to display  EKG: None  Radiology: No results found.   .Laceration Repair  Date/Time: 10/08/2023 1:41 AM  Performed by: Haze Lonni PARAS, MD Authorized by: Haze Lonni PARAS, MD   Consent:    Consent obtained:  Verbal   Consent given by:  Patient   Risks, benefits, and alternatives were discussed: yes     Risks discussed:  Infection, pain, retained foreign body and poor cosmetic result Universal protocol:    Procedure explained and questions answered to patient or proxy's satisfaction: yes     Site/side marked: yes     Immediately prior to procedure, a time out was called: yes     Patient identity confirmed:  Verbally with patient Anesthesia:    Anesthesia method:  Local infiltration   Local anesthetic:  Lidocaine  1% w/o epi Laceration details:    Location:  Ear   Ear location:  L ear   Length (cm):  2   Laceration depth: full thickness - lobule. Pre-procedure details:    Preparation:  Patient was prepped and draped in usual sterile fashion Exploration:  Limited defect created (wound extended): yes     Hemostasis achieved with:  Direct pressure   Contaminated: no   Treatment:    Area cleansed with:  Chlorhexidine    Amount of cleaning:  Standard   Irrigation solution:  Sterile saline   Irrigation method:  Syringe   Undermining:  Minimal   Scar revision: yes   Skin repair:    Repair method:  Sutures   Suture size:  5-0   Suture material:  Nylon   Suture technique:  Simple interrupted   Number of sutures:  5 Approximation:    Approximation:  Close Repair type:    Repair type:  Intermediate Post-procedure details:    Dressing:  Bulky dressing   Procedure completion:  Tolerated well, no immediate complications    Medications Ordered  in the ED  lidocaine  (PF) (XYLOCAINE ) 1 % injection 5 mL (5 mLs Infiltration Given 10/08/23 0042)                                    Medical Decision Making Risk Prescription drug management.   Patient presents for laceration to lobule of left ear.  Patient had an earring in her ear and it tore through the lobule when it caught on her shirt.  I suspect that the piercing was already elongated, most of the wound appeared epithelialized.  I therefore revised the edges of the wound with a scalpel to ensure adequate healing.  The margins were then approximated with simple interrupted sutures.     Final diagnoses:  Ear lobe laceration, left, initial encounter    ED Discharge Orders     None          Lun Muro, Lonni PARAS, MD 10/08/23 [phone]

## 2023-10-29 IMAGING — RF DG HIP (WITH PELVIS) OPERATIVE*L*
1 series · 5 of 5 positions shown · non-contrast
Comparison: CT done on 05/15/2021

CLINICAL DATA: Fracture left femur

EXAM:
OPERATIVE left HIP (WITH PELVIS IF PERFORMED)  VIEWS
TECHNIQUE: Fluoroscopic spot image(s) were submitted for interpretation
post-operatively.

[Series 1: unknown protocol · 0.14mm/px · 5 of 5 slices shown]
[im 1/5]
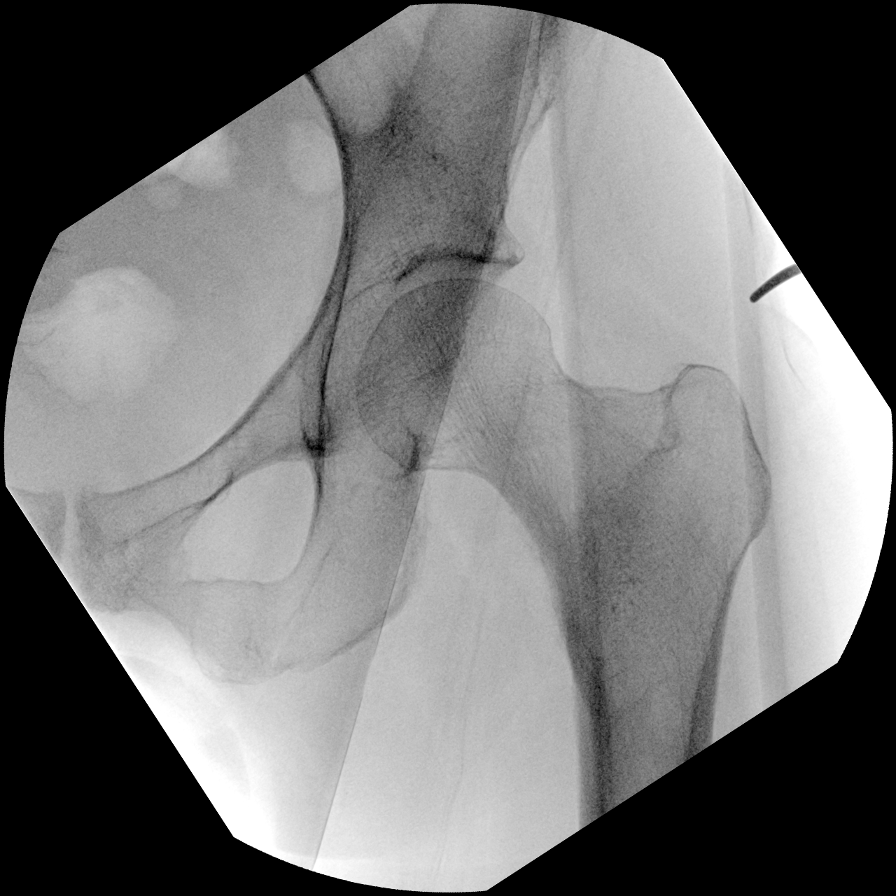
[im 2/5]
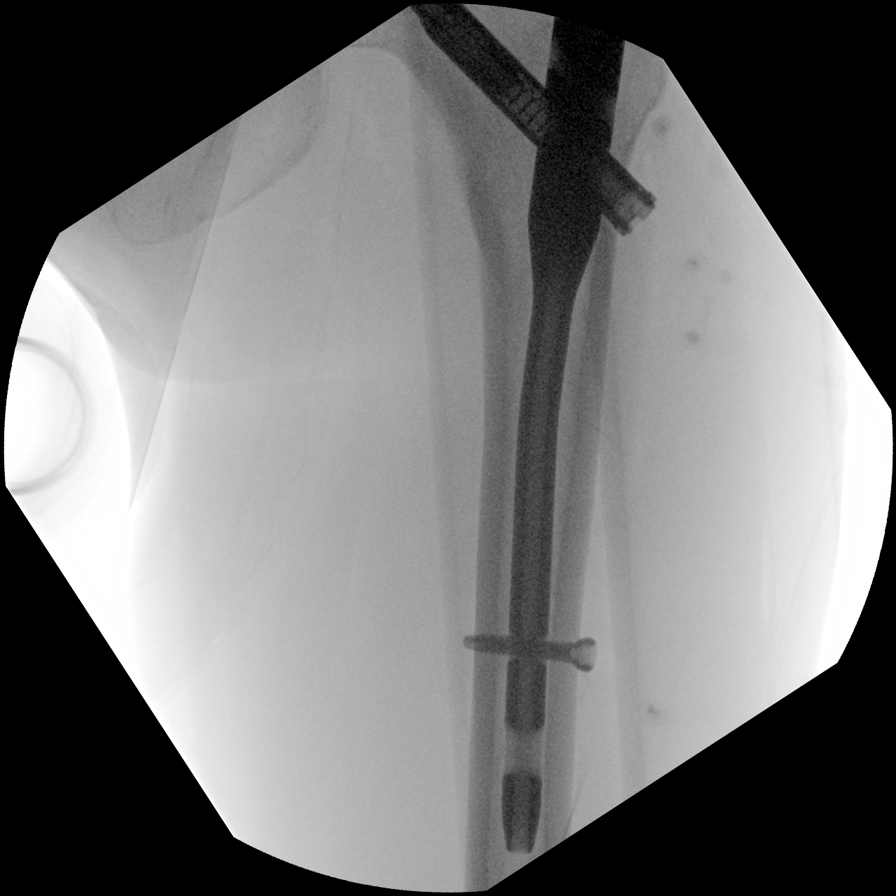
[im 3/5]
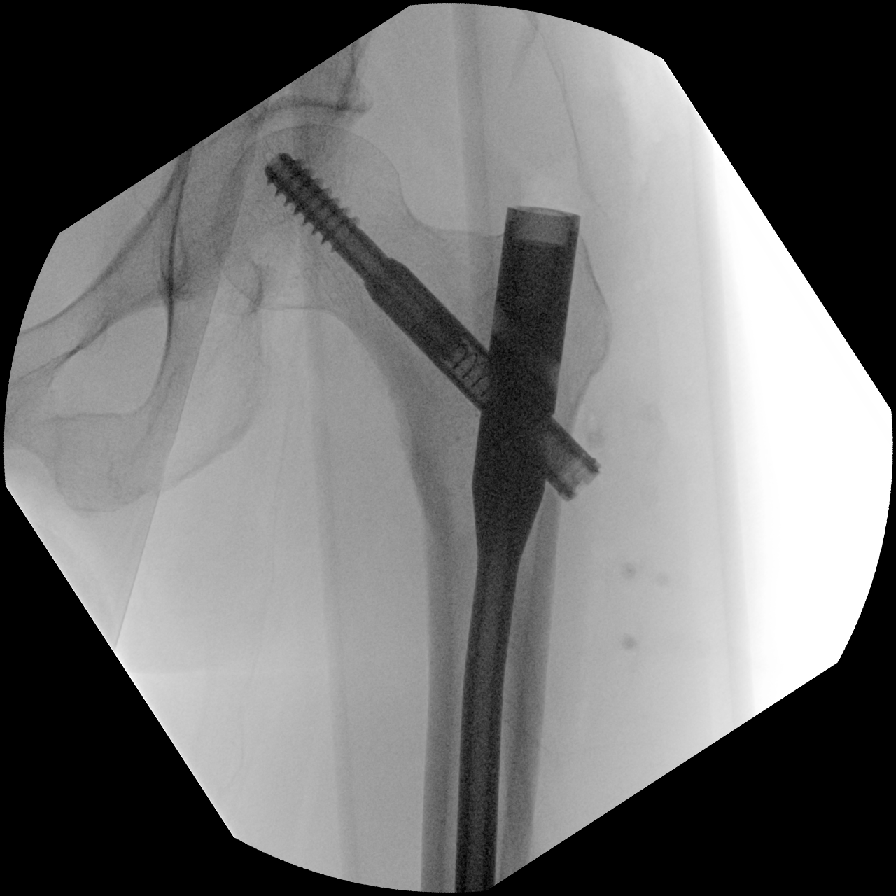
[im 4/5]
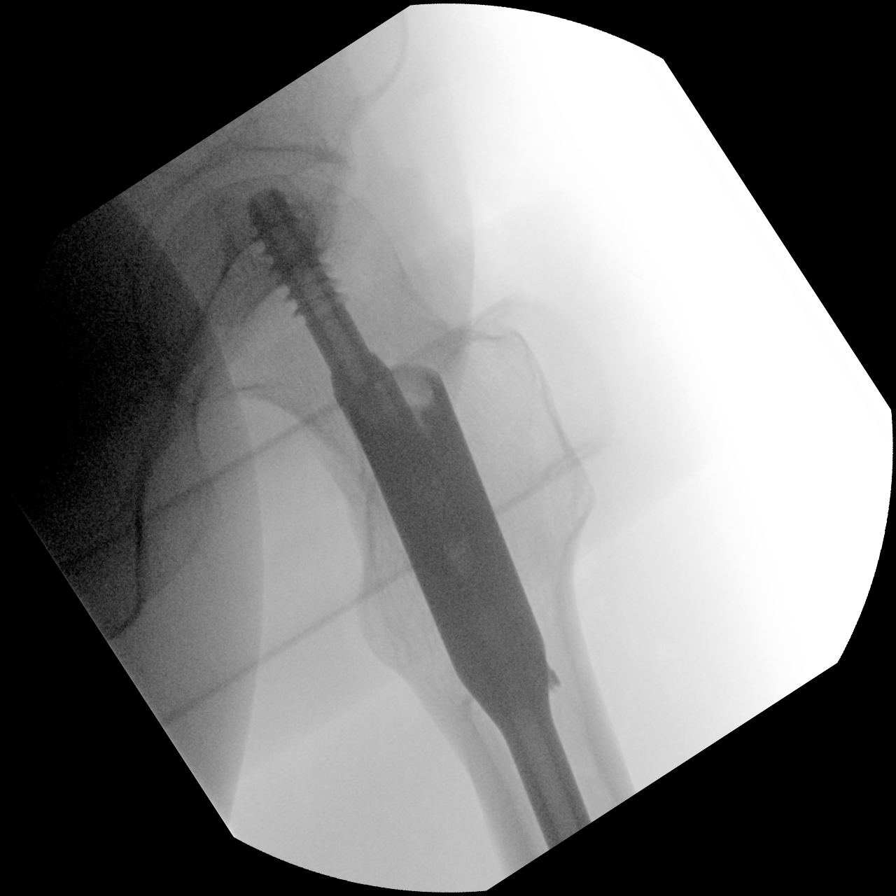
[im 5/5]
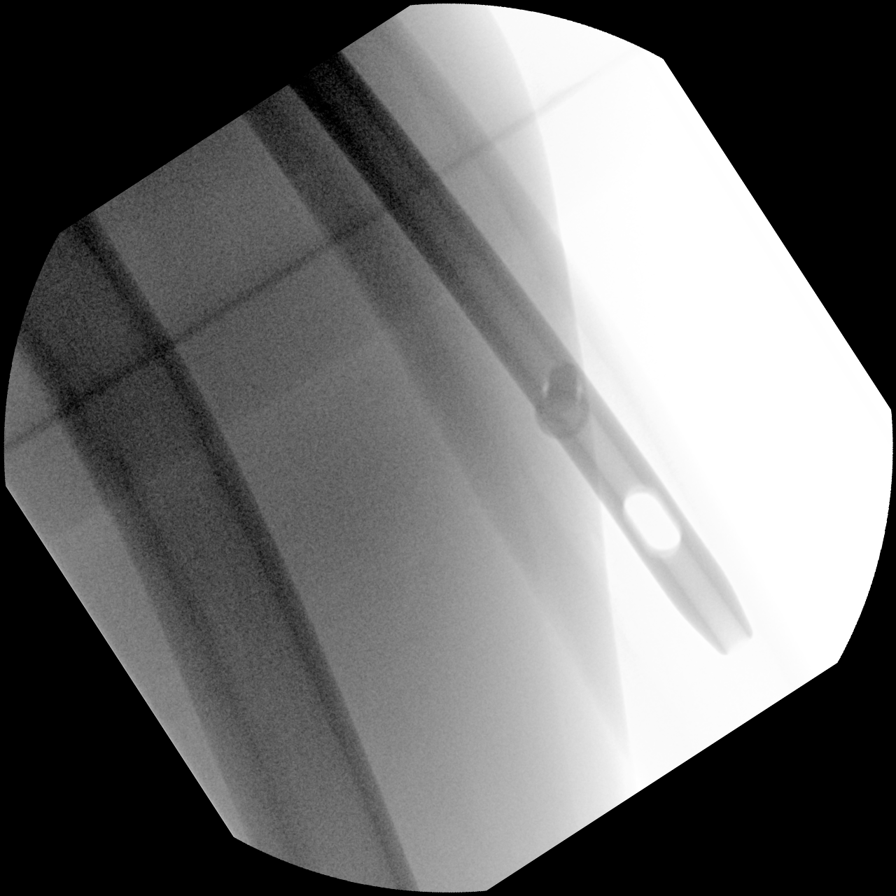

[5 of 5 positions shown; findings below may reference images not displayed]

FINDINGS: Fluoroscopic images show internal fixation of fracture of neck of
left femur with intramedullary rod. Fluoroscopic time was 66
seconds. Radiation dose is 14 mGy.
IMPRESSION: Fluoroscopic assistance was provided for internal fixation of
proximal left femur.
